# Patient Record
Sex: Female | Born: 1965 | Hispanic: Yes | Marital: Single | Smoking: Never smoker
Health system: Southern US, Community
[De-identification: ages and names within clinical notes are randomized; demographics above are authoritative.]

## PROBLEM LIST (undated history)

## (undated) DIAGNOSIS — E119 Type 2 diabetes mellitus without complications: Secondary | ICD-10-CM

## (undated) DIAGNOSIS — I10 Essential (primary) hypertension: Secondary | ICD-10-CM

---

## 2006-11-18 ENCOUNTER — Emergency Department: Payer: Self-pay | Admitting: General Practice

## 2006-12-14 ENCOUNTER — Other Ambulatory Visit: Payer: Self-pay

## 2006-12-17 ENCOUNTER — Ambulatory Visit: Payer: Self-pay | Admitting: Unknown Physician Specialty

## 2006-12-21 ENCOUNTER — Emergency Department: Payer: Self-pay | Admitting: Emergency Medicine

## 2009-01-10 ENCOUNTER — Emergency Department: Payer: Self-pay | Admitting: Emergency Medicine

## 2009-07-25 ENCOUNTER — Emergency Department: Payer: Self-pay | Admitting: Emergency Medicine

## 2010-08-01 ENCOUNTER — Emergency Department: Payer: Self-pay | Admitting: Emergency Medicine

## 2011-06-04 ENCOUNTER — Emergency Department: Payer: Self-pay | Admitting: Emergency Medicine

## 2011-06-21 ENCOUNTER — Emergency Department: Payer: Self-pay | Admitting: Internal Medicine

## 2013-09-29 LAB — URINALYSIS, COMPLETE
Blood: NEGATIVE
Glucose,UR: >=500 mg/dL (ref 0–75)
Leukocyte Esterase: NEGATIVE
Ph: 5 (ref 4.5–8.0)
Protein: 100
Specific Gravity: 1.02 (ref 1.003–1.030)
Squamous Epithelial: 4

## 2013-09-29 LAB — CBC
HGB: 14.2 g/dL (ref 12.0–16.0)
MCH: 23.6 pg — ABNORMAL LOW (ref 26.0–34.0)
MCHC: 31.4 g/dL — ABNORMAL LOW (ref 32.0–36.0)
MCV: 75 fL — ABNORMAL LOW (ref 80–100)
Platelet: 247 10*3/uL (ref 150–440)
RBC: 5.99 10*6/uL — ABNORMAL HIGH (ref 3.80–5.20)
RDW: 18.2 % — ABNORMAL HIGH (ref 11.5–14.5)

## 2013-09-29 LAB — COMPREHENSIVE METABOLIC PANEL
Albumin: 3.8 g/dL (ref 3.4–5.0)
Alkaline Phosphatase: 123 U/L (ref 50–136)
Anion Gap: 12 (ref 7–16)
BUN: 18 mg/dL (ref 7–18)
Chloride: 93 mmol/L — ABNORMAL LOW (ref 98–107)
Co2: 25 mmol/L (ref 21–32)
Creatinine: 1.72 mg/dL — ABNORMAL HIGH (ref 0.60–1.30)
EGFR (African American): 41 — ABNORMAL LOW
EGFR (Non-African Amer.): 35 — ABNORMAL LOW
Osmolality: 282 (ref 275–301)
Potassium: 4.3 mmol/L (ref 3.5–5.1)
SGOT(AST): 75 U/L — ABNORMAL HIGH (ref 15–37)
SGPT (ALT): 93 U/L — ABNORMAL HIGH (ref 12–78)
Sodium: 130 mmol/L — ABNORMAL LOW (ref 136–145)
Total Protein: 8.3 g/dL — ABNORMAL HIGH (ref 6.4–8.2)

## 2013-09-29 LAB — LIPASE, BLOOD: Lipase: 157 U/L (ref 73–393)

## 2013-09-29 LAB — TROPONIN I: Troponin-I: 0.08 ng/mL — ABNORMAL HIGH

## 2013-09-30 ENCOUNTER — Observation Stay: Payer: Self-pay | Admitting: Internal Medicine

## 2013-09-30 LAB — LIPID PANEL
HDL Cholesterol: 34 mg/dL — ABNORMAL LOW (ref 40–60)
Ldl Cholesterol, Calc: 108 mg/dL — ABNORMAL HIGH (ref 0–100)
Triglycerides: 140 mg/dL (ref 0–200)

## 2013-09-30 LAB — HEMOGLOBIN A1C: Hemoglobin A1C: 13 % — ABNORMAL HIGH (ref 4.2–6.3)

## 2013-09-30 LAB — CK TOTAL AND CKMB (NOT AT ARMC)
CK, Total: 43 U/L (ref 21–215)
CK-MB: 2.3 ng/mL (ref 0.5–3.6)

## 2013-09-30 LAB — BASIC METABOLIC PANEL
Anion Gap: 6 — ABNORMAL LOW (ref 7–16)
Calcium, Total: 8.9 mg/dL (ref 8.5–10.1)
Chloride: 98 mmol/L (ref 98–107)
Creatinine: 1.64 mg/dL — ABNORMAL HIGH (ref 0.60–1.30)
EGFR (Non-African Amer.): 37 — ABNORMAL LOW
Osmolality: 287 (ref 275–301)
Potassium: 3.8 mmol/L (ref 3.5–5.1)
Sodium: 134 mmol/L — ABNORMAL LOW (ref 136–145)

## 2013-09-30 LAB — TROPONIN I: Troponin-I: 0.07 ng/mL — ABNORMAL HIGH

## 2013-10-17 ENCOUNTER — Emergency Department: Payer: Self-pay

## 2013-10-17 LAB — CBC
HCT: 38.5 % (ref 35.0–47.0)
HGB: 11.5 g/dL — ABNORMAL LOW (ref 12.0–16.0)
MCHC: 29.8 g/dL — ABNORMAL LOW (ref 32.0–36.0)
Platelet: 225 10*3/uL (ref 150–440)
RBC: 4.83 10*6/uL (ref 3.80–5.20)
RDW: 18 % — ABNORMAL HIGH (ref 11.5–14.5)

## 2013-10-17 LAB — URINALYSIS, COMPLETE
Bilirubin,UR: NEGATIVE
Glucose,UR: >=500 mg/dL (ref 0–75)
Ketone: NEGATIVE
Leukocyte Esterase: NEGATIVE
Nitrite: NEGATIVE
Protein: NEGATIVE
Specific Gravity: 1.025 (ref 1.003–1.030)
WBC UR: 1 /HPF (ref 0–5)

## 2013-10-17 LAB — COMPREHENSIVE METABOLIC PANEL
Albumin: 4 g/dL (ref 3.4–5.0)
Alkaline Phosphatase: 229 U/L — ABNORMAL HIGH
Anion Gap: 14 (ref 7–16)
BUN: 27 mg/dL — ABNORMAL HIGH (ref 7–18)
Chloride: 80 mmol/L — ABNORMAL LOW (ref 98–107)
Creatinine: 1.8 mg/dL — ABNORMAL HIGH (ref 0.60–1.30)
EGFR (African American): 39 — ABNORMAL LOW
EGFR (Non-African Amer.): 33 — ABNORMAL LOW
Glucose: 1073 mg/dL (ref 65–99)
Potassium: 3.5 mmol/L (ref 3.5–5.1)
SGOT(AST): 58 U/L — ABNORMAL HIGH (ref 15–37)
Total Protein: 8.5 g/dL — ABNORMAL HIGH (ref 6.4–8.2)

## 2013-10-28 ENCOUNTER — Inpatient Hospital Stay: Payer: Self-pay | Admitting: Internal Medicine

## 2013-10-28 LAB — CBC
HCT: 39 % (ref 35.0–47.0)
HGB: 11.6 g/dL — ABNORMAL LOW (ref 12.0–16.0)
MCHC: 29.8 g/dL — ABNORMAL LOW (ref 32.0–36.0)
MCV: 82 fL (ref 80–100)
RBC: 4.75 10*6/uL (ref 3.80–5.20)
RDW: 18.6 % — ABNORMAL HIGH (ref 11.5–14.5)
WBC: 5.9 10*3/uL (ref 3.6–11.0)

## 2013-10-28 LAB — COMPREHENSIVE METABOLIC PANEL
Alkaline Phosphatase: 207 U/L — ABNORMAL HIGH
Anion Gap: 14 (ref 7–16)
Bilirubin,Total: 1.4 mg/dL — ABNORMAL HIGH (ref 0.2–1.0)
Calcium, Total: 10.1 mg/dL (ref 8.5–10.1)
Co2: 24 mmol/L (ref 21–32)
EGFR (African American): 28 — ABNORMAL LOW
Glucose: 1298 mg/dL (ref 65–99)
Osmolality: 297 (ref 275–301)
Potassium: 4.6 mmol/L (ref 3.5–5.1)
Sodium: 108 mmol/L — CL (ref 136–145)

## 2013-10-28 LAB — TSH: Thyroid Stimulating Horm: 3.86 u[IU]/mL

## 2013-10-29 LAB — BASIC METABOLIC PANEL
Anion Gap: 12 (ref 7–16)
BUN: 37 mg/dL — ABNORMAL HIGH (ref 7–18)
Calcium, Total: 10.3 mg/dL — ABNORMAL HIGH (ref 8.5–10.1)
Chloride: 92 mmol/L — ABNORMAL LOW (ref 98–107)
Co2: 29 mmol/L (ref 21–32)
Creatinine: 2.18 mg/dL — ABNORMAL HIGH (ref 0.60–1.30)
Creatinine: 2.3 mg/dL — ABNORMAL HIGH (ref 0.60–1.30)
EGFR (African American): 29 — ABNORMAL LOW
EGFR (Non-African Amer.): 25 — ABNORMAL LOW
Glucose: 214 mg/dL — ABNORMAL HIGH (ref 65–99)
Osmolality: 276 (ref 275–301)
Osmolality: 290 (ref 275–301)
Potassium: 3.2 mmol/L — ABNORMAL LOW (ref 3.5–5.1)
Potassium: 3.3 mmol/L — ABNORMAL LOW (ref 3.5–5.1)

## 2013-10-29 LAB — URINALYSIS, COMPLETE
Bilirubin,UR: NEGATIVE
Glucose,UR: >=500 mg/dL (ref 0–75)
Ph: 6 (ref 4.5–8.0)
Protein: NEGATIVE
Specific Gravity: 1.021 (ref 1.003–1.030)
Squamous Epithelial: 1
WBC UR: 8 /HPF (ref 0–5)

## 2013-10-30 LAB — CBC WITH DIFFERENTIAL/PLATELET
Basophil #: 0 10*3/uL (ref 0.0–0.1)
Basophil %: 1 %
HCT: 31.6 % — ABNORMAL LOW (ref 35.0–47.0)
Lymphocyte #: 1.7 10*3/uL (ref 1.0–3.6)
Lymphocyte %: 36.8 %
MCHC: 32.4 g/dL (ref 32.0–36.0)
Monocyte #: 0.5 x10 3/mm (ref 0.2–0.9)
Neutrophil %: 49.9 %
Platelet: 126 10*3/uL — ABNORMAL LOW (ref 150–440)
RBC: 4.19 10*6/uL (ref 3.80–5.20)
RDW: 19.1 % — ABNORMAL HIGH (ref 11.5–14.5)

## 2013-10-30 LAB — BASIC METABOLIC PANEL
BUN: 25 mg/dL — ABNORMAL HIGH (ref 7–18)
Calcium, Total: 9.5 mg/dL (ref 8.5–10.1)
Chloride: 100 mmol/L (ref 98–107)
Creatinine: 1.42 mg/dL — ABNORMAL HIGH (ref 0.60–1.30)
EGFR (Non-African Amer.): 44 — ABNORMAL LOW
Glucose: 247 mg/dL — ABNORMAL HIGH (ref 65–99)
Osmolality: 281 (ref 275–301)

## 2013-10-30 LAB — PHOSPHORUS: Phosphorus: 2 mg/dL — ABNORMAL LOW (ref 2.5–4.9)

## 2013-11-14 IMAGING — CT CT ABD-PELV W/ CM
2 of 5 series · 16 of 46 positions shown, 18 images · IV contrast (isovue)
Comparison: Abdominal ultrasound from the same day reported
separately.

CLINICAL DATA: 47-year- female with right abdominal pain. Initial
encounter.

EXAM:
CT ABDOMEN AND PELVIS WITH CONTRAST
TECHNIQUE: Multidetector CT imaging of the abdomen and pelvis was performed
using the standard protocol following bolus administration of
intravenous contrast.
CONTRAST:  100 mL Isovue 370.

[Series 2: routine abd pel with · axial · 0.81mm/px · z∈[+458,+868]mm · 13 of 94 slices shown, 15 images]
[im 6/94  soft-tissue]
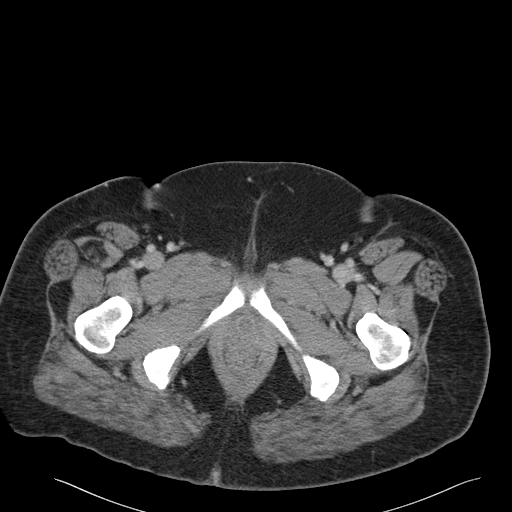
[im 6/94  bone]
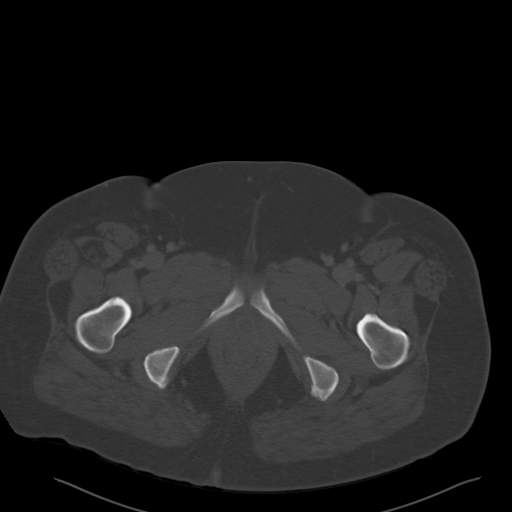
[im 11/94  soft-tissue]
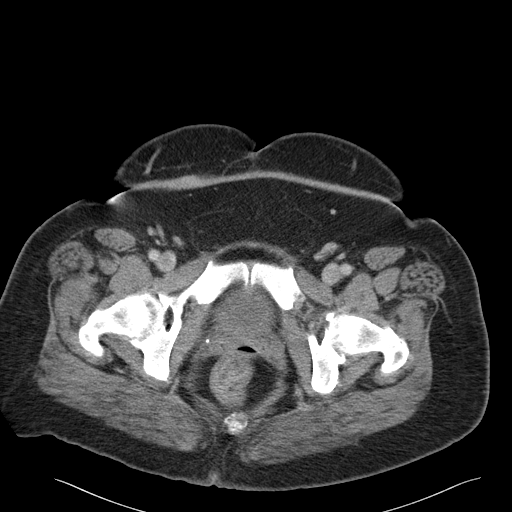
[im 21/94  soft-tissue]
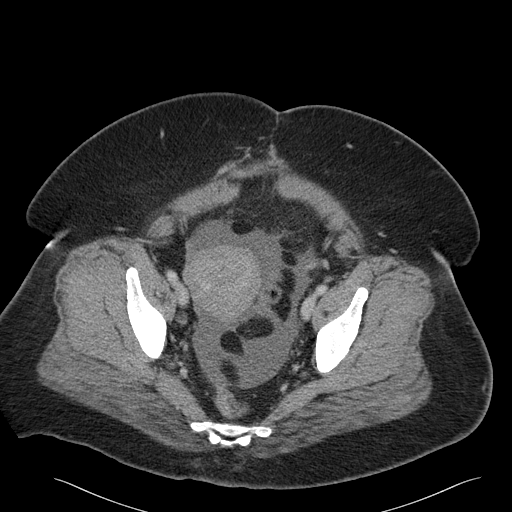
[im 26/94  soft-tissue]
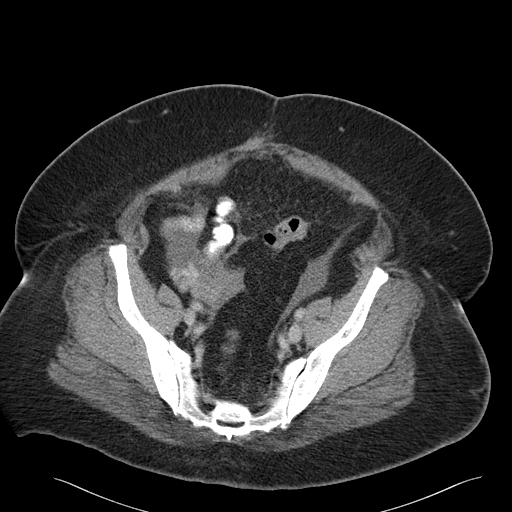
[im 32/94  soft-tissue]
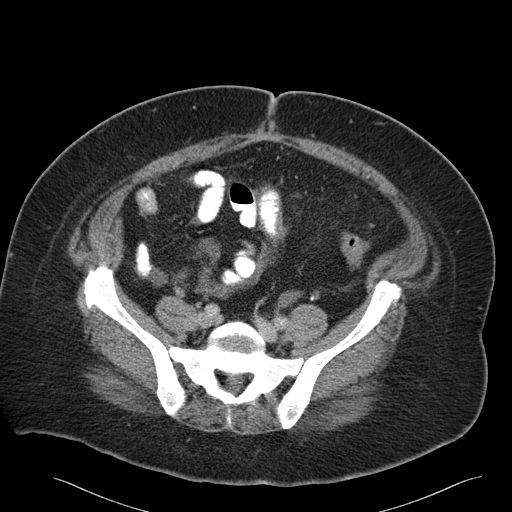
[im 42/94  soft-tissue]
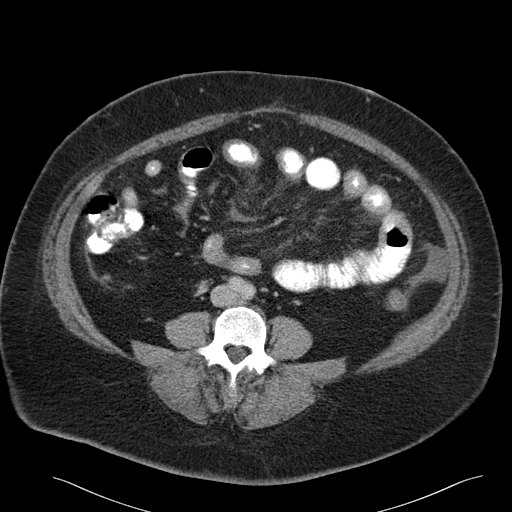
[im 47/94  soft-tissue]
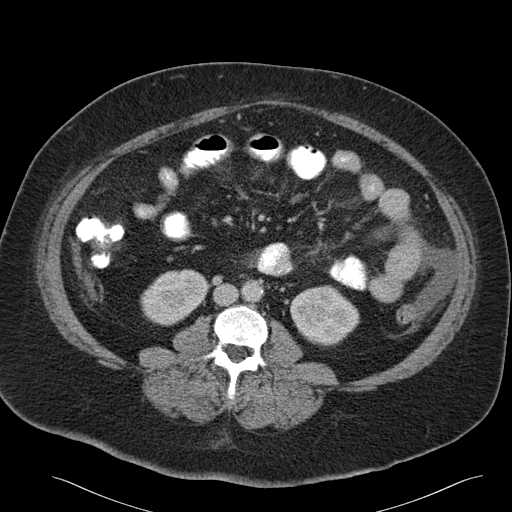
[im 52/94  soft-tissue]
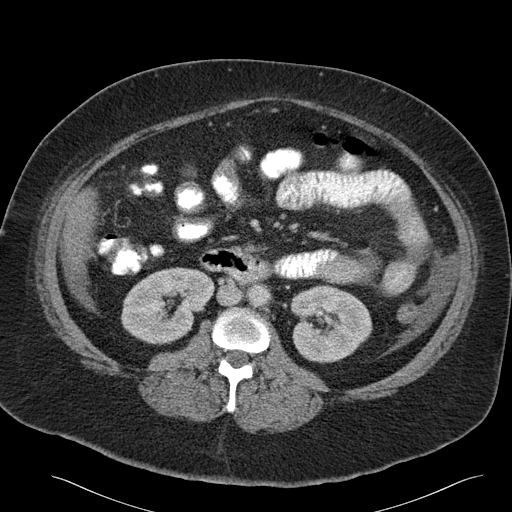
[im 63/94  soft-tissue]
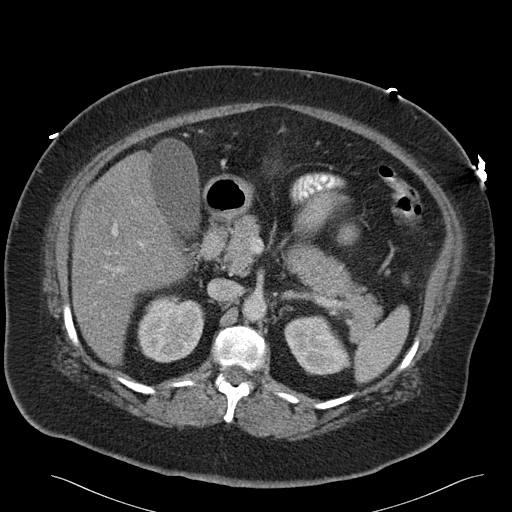
[im 63/94  bone]
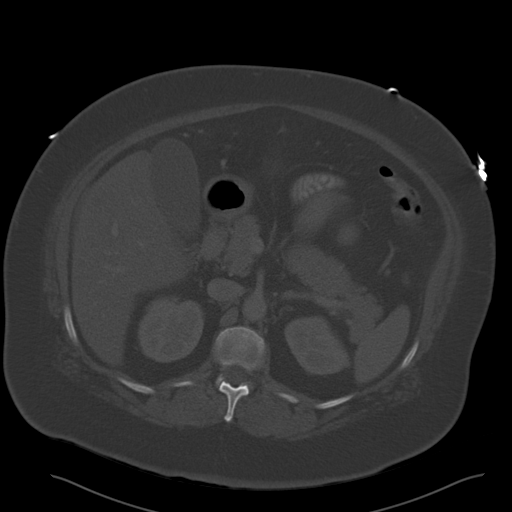
[im 68/94  soft-tissue]
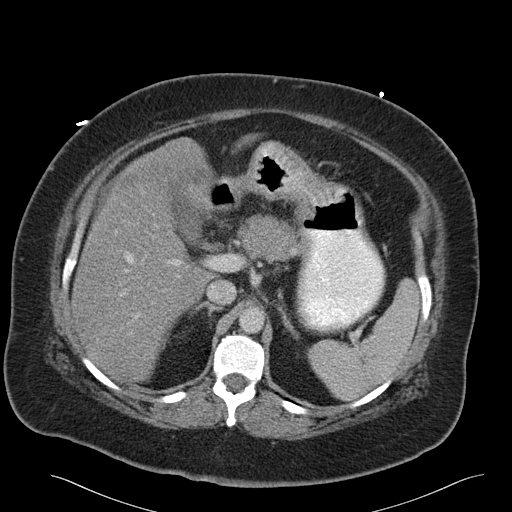
[im 73/94  soft-tissue]
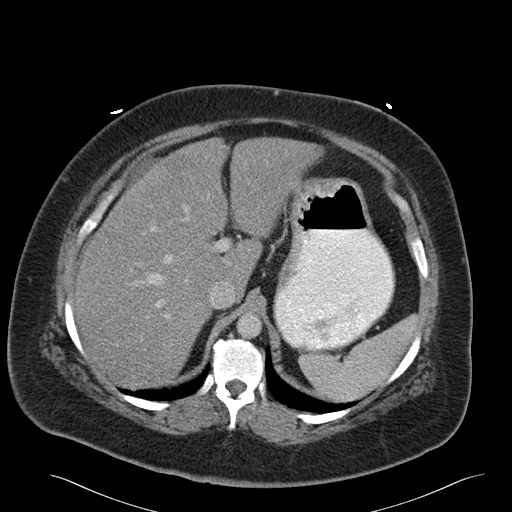
[im 83/94  soft-tissue]
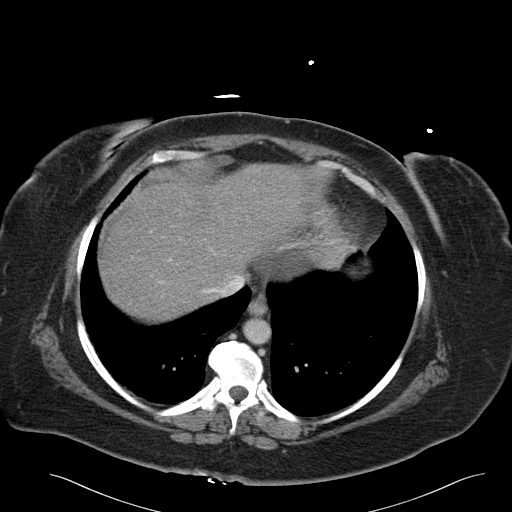
[im 88/94  soft-tissue]
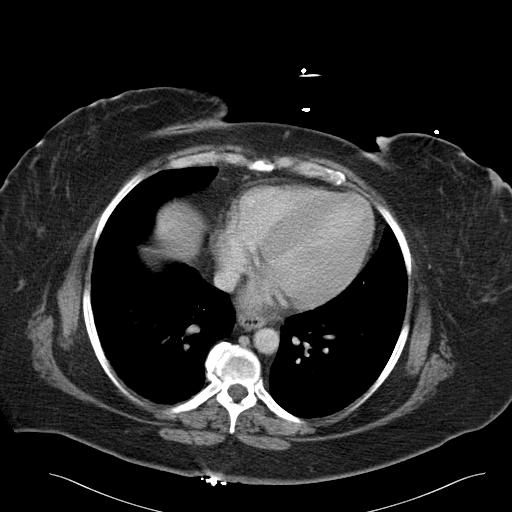

[Series 6: cor routine abd pel with · coronal · 0.91mm/px · 3 of 142 slices shown]
[im 48/142  soft-tissue]
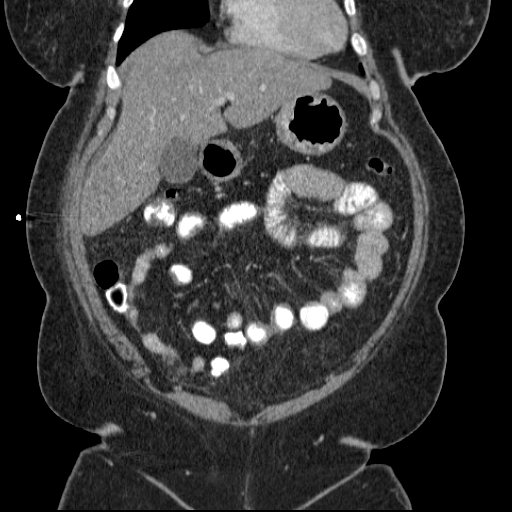
[im 63/142  soft-tissue]
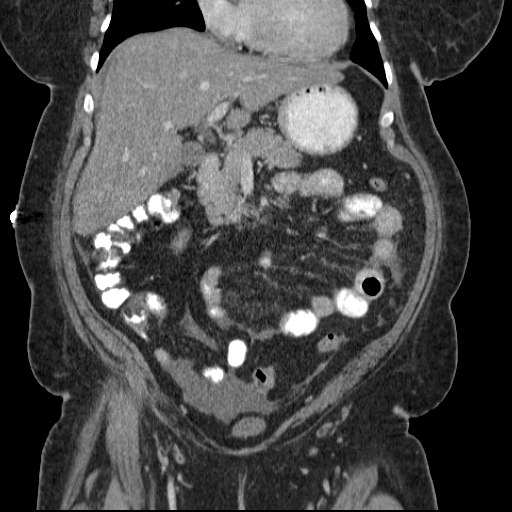
[im 79/142  soft-tissue]
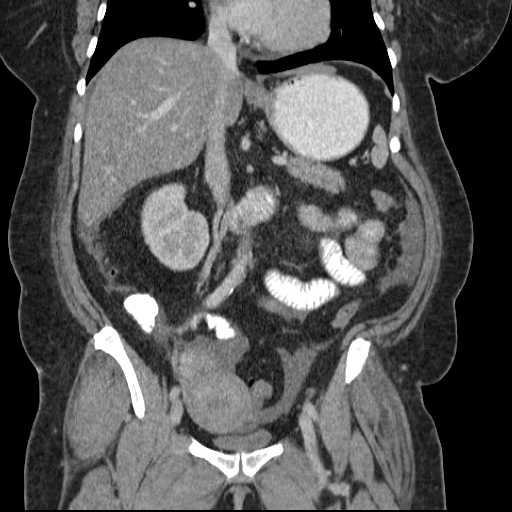

[16 of 46 positions shown; findings below may reference images not displayed]

FINDINGS: Mild bibasilar atelectasis. Mild cardiomegaly. No pericardial or
pleural effusion.

No acute osseous abnormality identified.

Small volume of simple appearing free fluid in the lower abdomen and
pelvis. The bladder is diminutive and appears within normal limits.
Rectum appears normal. Uterus and adnexal within normal limits.

Sigmoid colon appears within normal limits. There is free fluid in
the left pericolic gutter as well. The left colon does not appear
inflamed. Oral contrast has reached the splenic flexure. The
transverse colon appears within normal limits. There is trace right
pericolic gutter fluid. The right colon, cecum and appendix also
appear within normal limits. The appendix contains air and is
nondilated (series 2, image 51).

There does appear to be some small bowel wall thickening, somewhat
generalized. No dilated small bowel. Stomach and duodenum within
normal limits.

Hepatic steatosis. Small volume perihepatic fluid. Gallbladder
within normal limits. Main portal vein is patent with a diameter of
13 mm. The spleen is not enlarged. The remaining portal venous
system appears within normal limits.

Pancreas and adrenal glands within normal limits. Kidneys appear
normal. Major arterial structures in the abdomen and pelvis are
patent. Mild Aortoiliac calcified atherosclerosis noted. No
lymphadenopathy identified.
IMPRESSION: 1. Small to moderate volume free fluid in the abdomen and pelvis,
nonspecific, but combined with findings of hepatic steatosis and
prominent caliber of the main portal vein, consider ascites related
to liver disease with portal venous hypertension. No splenomegaly or
other stigmata of cirrhosis identified.

2. No focal large bowel inflammation, an the appendix appears
normal. There is some small bowel wall thickening in the mid
abdomen, but this may be reactive due to the presence of ascites.
Main differential consideration is nonspecific enteritis. There is
no bowel obstruction, contrast has reached the splenic flexure.

## 2013-12-14 IMAGING — US US RENAL KIDNEY
1 series · 14 of 25 positions shown · non-contrast
Comparison: CT scan of the abdomen and pelvis dated [place] she
[DATE].

CLINICAL DATA: Acute renal failure.

EXAM:
RENAL/URINARY TRACT ULTRASOUND COMPLETE

[Series 1: us renal kidney · 0.25mm/px · 14 of 32 slices shown]
[im 1/32]
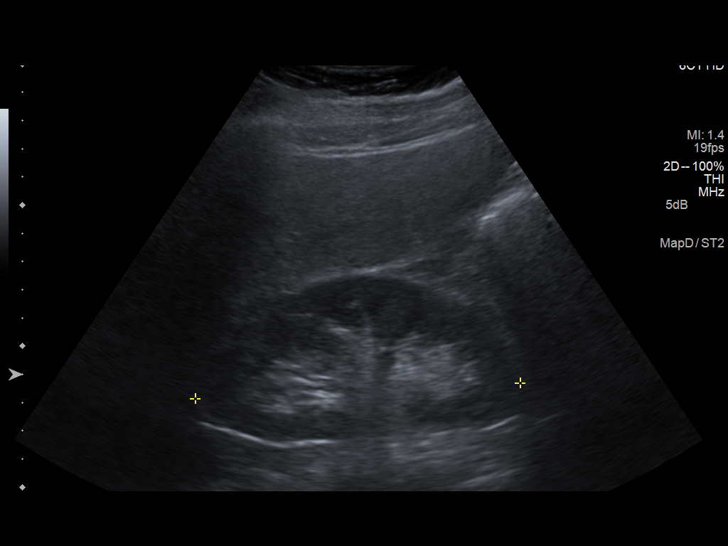
[im 3/32]
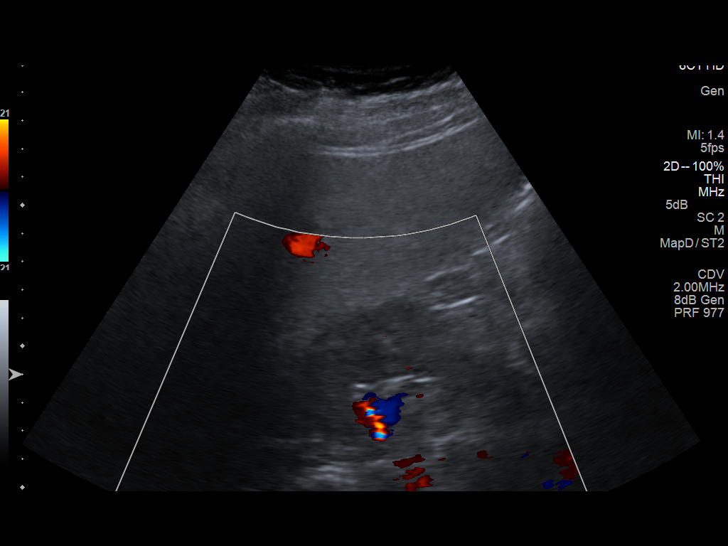
[im 6/32]
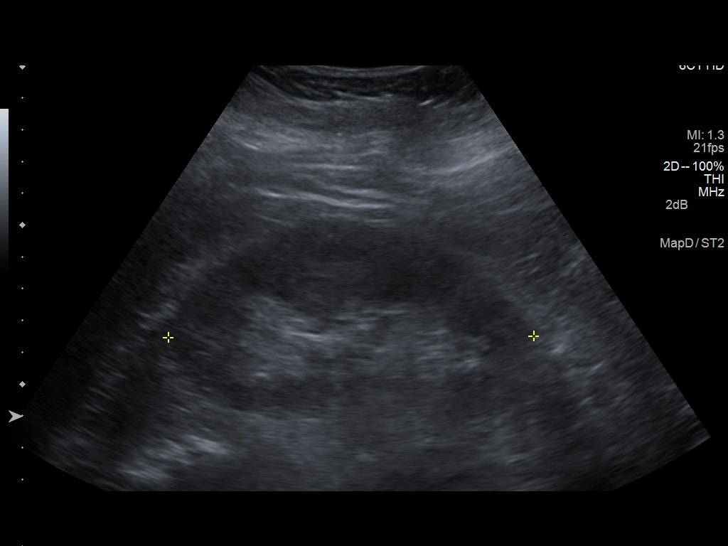
[im 8/32]
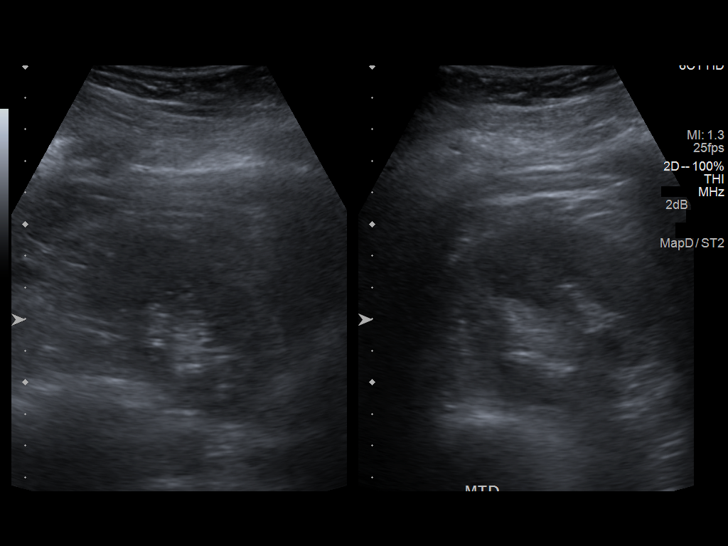
[im 11/32]
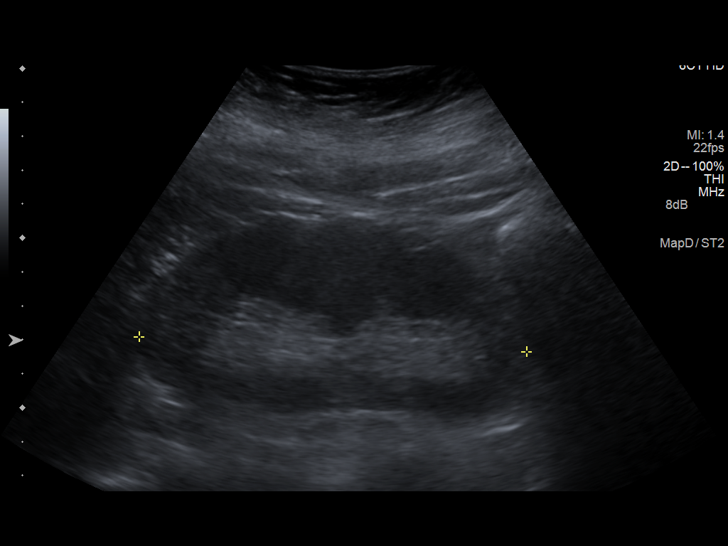
[im 12/32]
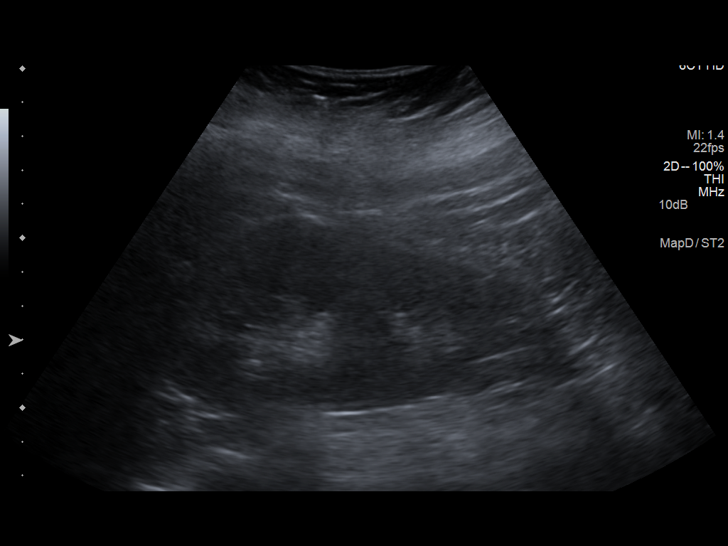
[im 15/32]
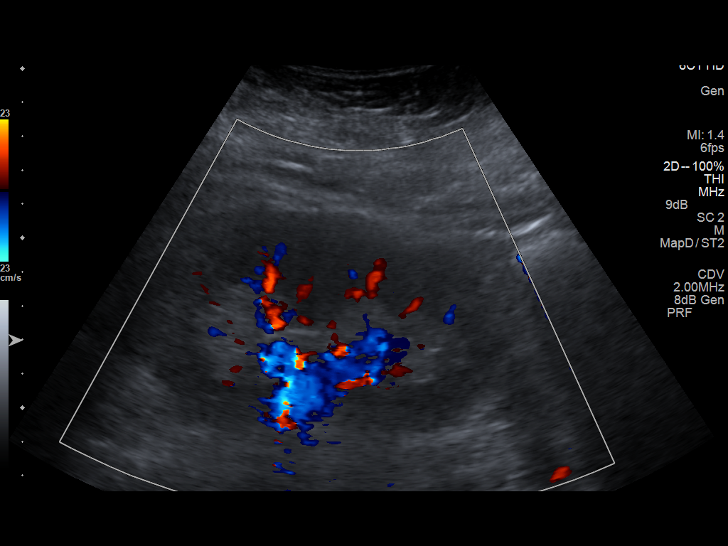
[im 17/32]
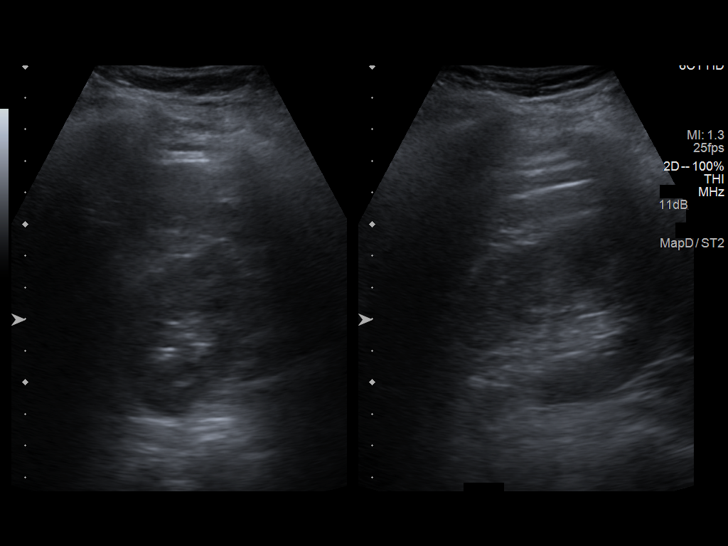
[im 20/32]
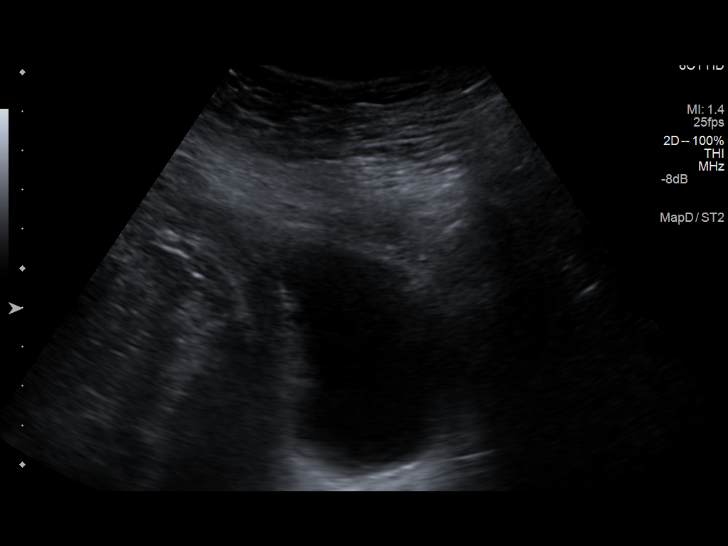
[im 21/32]
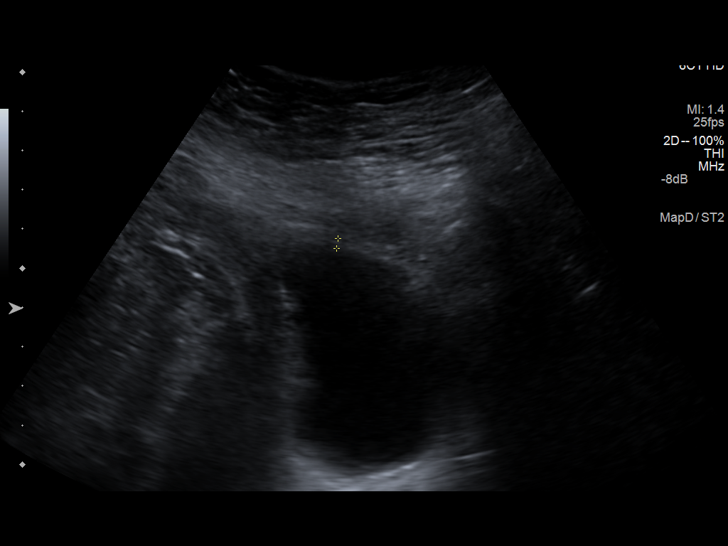
[im 24/32]
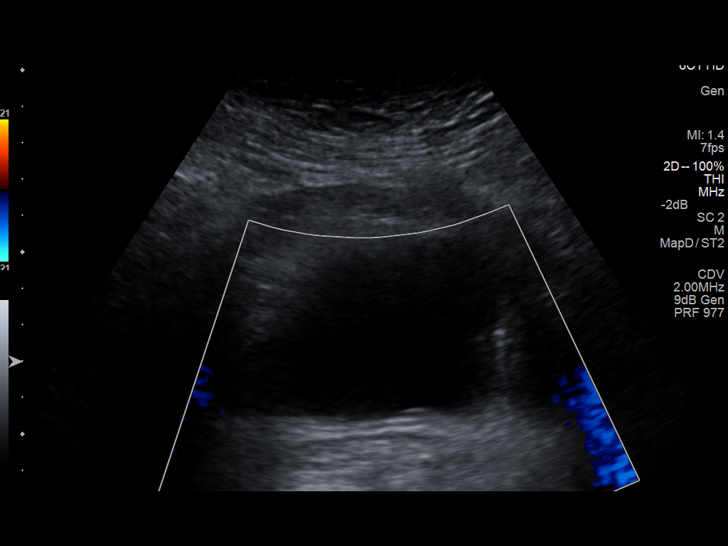
[im 26/32]
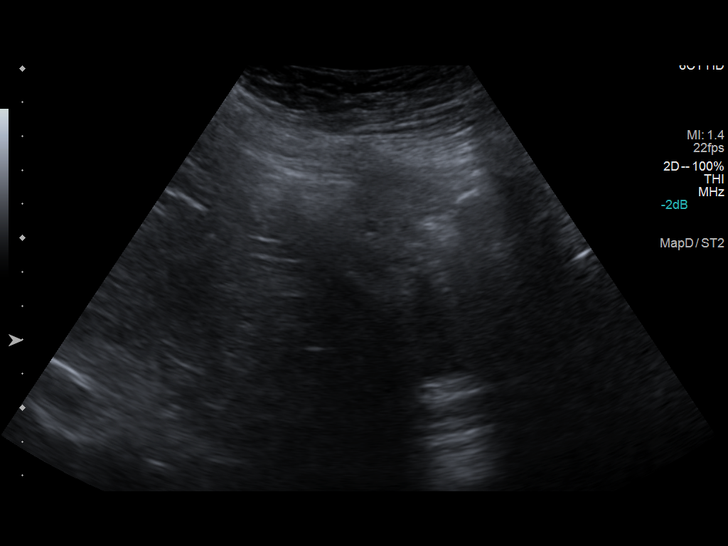
[im 29/32]
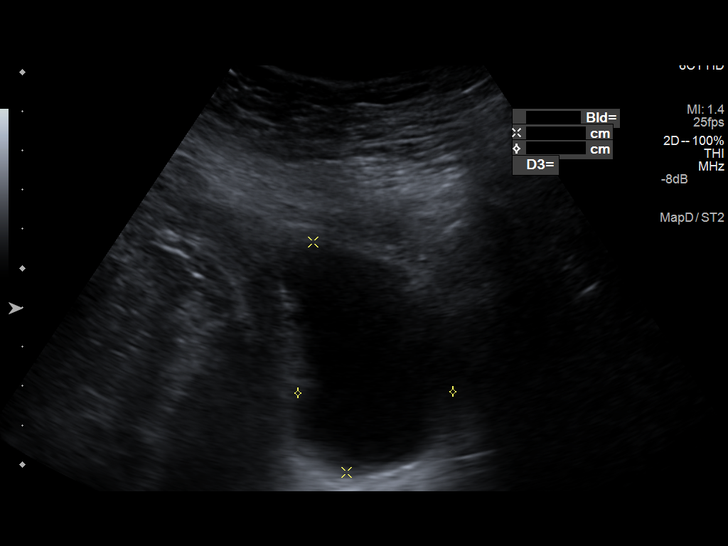
[im 32/32]
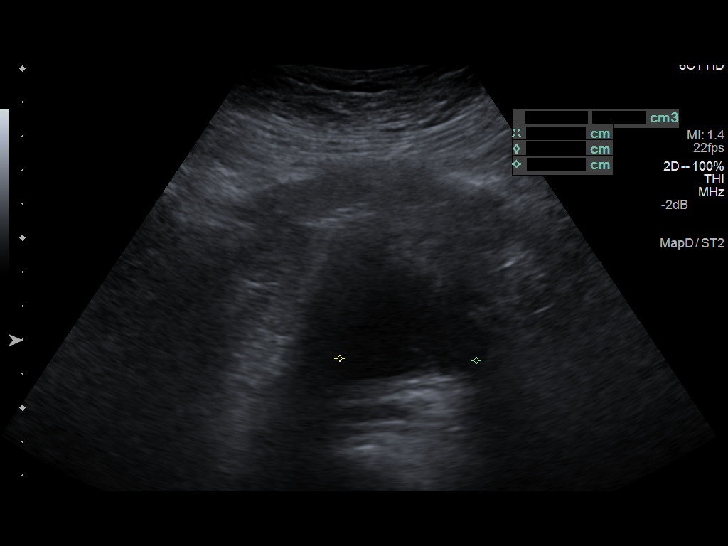

[14 of 25 positions shown; findings below may reference images not displayed]

FINDINGS: Right Kidney:

Length: 11.5 cm. Echogenicity within normal limits. No mass or
hydronephrosis visualized.

Left Kidney:

Length: 12.1 cm. Echogenicity within normal limits. No mass or
hydronephrosis visualized.

Bladder:

The urinary bladder is partially distended and normal in appearance.
IMPRESSION: 1. The kidneys are normal in echotexture and contour with no
evidence of stones nor hydronephrosis.
2. The partially distended urinary bladder is normal in appearance.

## 2014-04-26 ENCOUNTER — Emergency Department: Payer: Self-pay | Admitting: Emergency Medicine

## 2014-04-26 LAB — BASIC METABOLIC PANEL
Anion Gap: 2 — ABNORMAL LOW (ref 7–16)
BUN: 10 mg/dL (ref 7–18)
CHLORIDE: 106 mmol/L (ref 98–107)
Calcium, Total: 9.3 mg/dL (ref 8.5–10.1)
Co2: 29 mmol/L (ref 21–32)
Creatinine: 0.73 mg/dL (ref 0.60–1.30)
EGFR (African American): >60
Glucose: 76 mg/dL (ref 65–99)
OSMOLALITY: 272 (ref 275–301)
Potassium: 3.9 mmol/L (ref 3.5–5.1)
Sodium: 137 mmol/L (ref 136–145)

## 2014-04-26 LAB — CBC
HCT: 33.6 % — AB (ref 35.0–47.0)
HGB: 10.6 g/dL — ABNORMAL LOW (ref 12.0–16.0)
MCH: 24.8 pg — ABNORMAL LOW (ref 26.0–34.0)
MCHC: 31.5 g/dL — AB (ref 32.0–36.0)
MCV: 79 fL — AB (ref 80–100)
Platelet: 180 10*3/uL (ref 150–440)
RBC: 4.27 10*6/uL (ref 3.80–5.20)
RDW: 16.6 % — ABNORMAL HIGH (ref 11.5–14.5)
WBC: 8.3 10*3/uL (ref 3.6–11.0)

## 2014-05-04 ENCOUNTER — Emergency Department: Payer: Self-pay | Admitting: Internal Medicine

## 2014-05-04 LAB — URINALYSIS, COMPLETE
BILIRUBIN, UR: NEGATIVE
Bacteria: NONE SEEN
Glucose,UR: NEGATIVE mg/dL (ref 0–75)
Ketone: NEGATIVE
NITRITE: NEGATIVE
PH: 5 (ref 4.5–8.0)
PROTEIN: NEGATIVE
RBC,UR: 40 /HPF (ref 0–5)
Specific Gravity: 1.021 (ref 1.003–1.030)
WBC UR: <1 /HPF (ref 0–5)

## 2014-05-04 LAB — CBC
HCT: 34.2 % — ABNORMAL LOW (ref 35.0–47.0)
HGB: 10.9 g/dL — ABNORMAL LOW (ref 12.0–16.0)
MCH: 24.9 pg — ABNORMAL LOW (ref 26.0–34.0)
MCHC: 31.8 g/dL — ABNORMAL LOW (ref 32.0–36.0)
MCV: 79 fL — ABNORMAL LOW (ref 80–100)
Platelet: 230 10*3/uL (ref 150–440)
RBC: 4.35 10*6/uL (ref 3.80–5.20)
RDW: 17.2 % — ABNORMAL HIGH (ref 11.5–14.5)
WBC: 11 10*3/uL (ref 3.6–11.0)

## 2014-05-30 ENCOUNTER — Observation Stay: Payer: Self-pay | Admitting: Internal Medicine

## 2014-05-30 LAB — BASIC METABOLIC PANEL
ANION GAP: 6 — AB (ref 7–16)
BUN: 13 mg/dL (ref 7–18)
CHLORIDE: 103 mmol/L (ref 98–107)
CO2: 26 mmol/L (ref 21–32)
Calcium, Total: 9.7 mg/dL (ref 8.5–10.1)
Creatinine: 0.83 mg/dL (ref 0.60–1.30)
EGFR (African American): >60
EGFR (Non-African Amer.): >60
GLUCOSE: 154 mg/dL — AB (ref 65–99)
OSMOLALITY: 273 (ref 275–301)
POTASSIUM: 4.3 mmol/L (ref 3.5–5.1)
Sodium: 135 mmol/L — ABNORMAL LOW (ref 136–145)

## 2014-05-30 LAB — CBC
HCT: 22.4 % — AB (ref 35.0–47.0)
HGB: 7.1 g/dL — AB (ref 12.0–16.0)
MCH: 24.9 pg — AB (ref 26.0–34.0)
MCHC: 31.9 g/dL — ABNORMAL LOW (ref 32.0–36.0)
MCV: 78 fL — ABNORMAL LOW (ref 80–100)
Platelet: 202 10*3/uL (ref 150–440)
RBC: 2.87 10*6/uL — ABNORMAL LOW (ref 3.80–5.20)
RDW: 17.4 % — ABNORMAL HIGH (ref 11.5–14.5)
WBC: 9.4 10*3/uL (ref 3.6–11.0)

## 2014-05-30 LAB — PRO B NATRIURETIC PEPTIDE: B-Type Natriuretic Peptide: 46 pg/mL (ref 0–125)

## 2014-05-30 LAB — TROPONIN I
Troponin-I: <0.02 ng/mL
Troponin-I: <0.02 ng/mL

## 2014-05-31 LAB — CBC WITH DIFFERENTIAL/PLATELET
BASOS PCT: 0.5 %
Basophil #: 0.1 10*3/uL (ref 0.0–0.1)
EOS ABS: 0.3 10*3/uL (ref 0.0–0.7)
Eosinophil %: 2.9 %
HCT: 26.5 % — ABNORMAL LOW (ref 35.0–47.0)
HGB: 8.4 g/dL — ABNORMAL LOW (ref 12.0–16.0)
LYMPHS ABS: 2.2 10*3/uL (ref 1.0–3.6)
Lymphocyte %: 22.8 %
MCH: 24.6 pg — ABNORMAL LOW (ref 26.0–34.0)
MCHC: 31.5 g/dL — AB (ref 32.0–36.0)
MCV: 78 fL — ABNORMAL LOW (ref 80–100)
Monocyte #: 0.7 x10 3/mm (ref 0.2–0.9)
Monocyte %: 6.9 %
NEUTROS ABS: 6.5 10*3/uL (ref 1.4–6.5)
Neutrophil %: 66.9 %
Platelet: 205 10*3/uL (ref 150–440)
RBC: 3.4 10*6/uL — ABNORMAL LOW (ref 3.80–5.20)
RDW: 17 % — ABNORMAL HIGH (ref 11.5–14.5)
WBC: 9.7 10*3/uL (ref 3.6–11.0)

## 2014-06-18 LAB — CBC
HCT: 22.2 % — AB (ref 35.0–47.0)
HGB: 7 g/dL — ABNORMAL LOW (ref 12.0–16.0)
MCH: 26.1 pg (ref 26.0–34.0)
MCHC: 31.5 g/dL — ABNORMAL LOW (ref 32.0–36.0)
MCV: 83 fL (ref 80–100)
Platelet: 236 10*3/uL (ref 150–440)
RBC: 2.68 10*6/uL — ABNORMAL LOW (ref 3.80–5.20)
RDW: 21.2 % — ABNORMAL HIGH (ref 11.5–14.5)
WBC: 5.8 10*3/uL (ref 3.6–11.0)

## 2014-06-18 LAB — COMPREHENSIVE METABOLIC PANEL
Albumin: 3.4 g/dL (ref 3.4–5.0)
Alkaline Phosphatase: 75 U/L
Anion Gap: 7 (ref 7–16)
BILIRUBIN TOTAL: 0.8 mg/dL (ref 0.2–1.0)
BUN: 12 mg/dL (ref 7–18)
CALCIUM: 9 mg/dL (ref 8.5–10.1)
Chloride: 108 mmol/L — ABNORMAL HIGH (ref 98–107)
Co2: 25 mmol/L (ref 21–32)
Creatinine: 1.03 mg/dL (ref 0.60–1.30)
EGFR (African American): >60
EGFR (Non-African Amer.): >60
GLUCOSE: 152 mg/dL — AB (ref 65–99)
Osmolality: 282 (ref 275–301)
POTASSIUM: 3.6 mmol/L (ref 3.5–5.1)
SGOT(AST): 16 U/L (ref 15–37)
SGPT (ALT): 18 U/L
Sodium: 140 mmol/L (ref 136–145)
Total Protein: 7.2 g/dL (ref 6.4–8.2)

## 2014-06-18 LAB — PROTIME-INR
INR: 1
Prothrombin Time: 13.5 secs (ref 11.5–14.7)

## 2014-06-18 LAB — TROPONIN I

## 2014-06-18 LAB — PRO B NATRIURETIC PEPTIDE: B-Type Natriuretic Peptide: 444 pg/mL — ABNORMAL HIGH (ref 0–125)

## 2014-06-19 ENCOUNTER — Observation Stay: Payer: Self-pay | Admitting: Internal Medicine

## 2014-06-19 LAB — CK TOTAL AND CKMB (NOT AT ARMC)
CK, TOTAL: 81 U/L
CK, TOTAL: 92 U/L
CK, Total: 81 U/L
CK-MB: 0.6 ng/mL (ref 0.5–3.6)
CK-MB: 0.7 ng/mL (ref 0.5–3.6)
CK-MB: 0.8 ng/mL (ref 0.5–3.6)

## 2014-06-19 LAB — TROPONIN I

## 2014-06-19 LAB — IRON AND TIBC
IRON SATURATION: 12 %
Iron Bind.Cap.(Total): 430 ug/dL (ref 250–450)
Iron: 50 ug/dL (ref 50–170)
Unbound Iron-Bind.Cap.: 380 ug/dL

## 2014-06-19 LAB — FERRITIN: FERRITIN (ARMC): 10 ng/mL (ref 8–388)

## 2014-06-19 LAB — HEMOGLOBIN: HGB: 9.2 g/dL — ABNORMAL LOW (ref 12.0–16.0)

## 2014-06-24 ENCOUNTER — Emergency Department: Payer: Self-pay | Admitting: Emergency Medicine

## 2014-07-22 ENCOUNTER — Emergency Department: Payer: Self-pay | Admitting: Internal Medicine

## 2014-08-03 IMAGING — CR DG CHEST 1V PORT
1 series · 1 of 1 positions shown · non-contrast
Comparison: 05/30/2014

CLINICAL DATA: Sternal chest pain

EXAM:
PORTABLE CHEST - 1 VIEW

[ap]
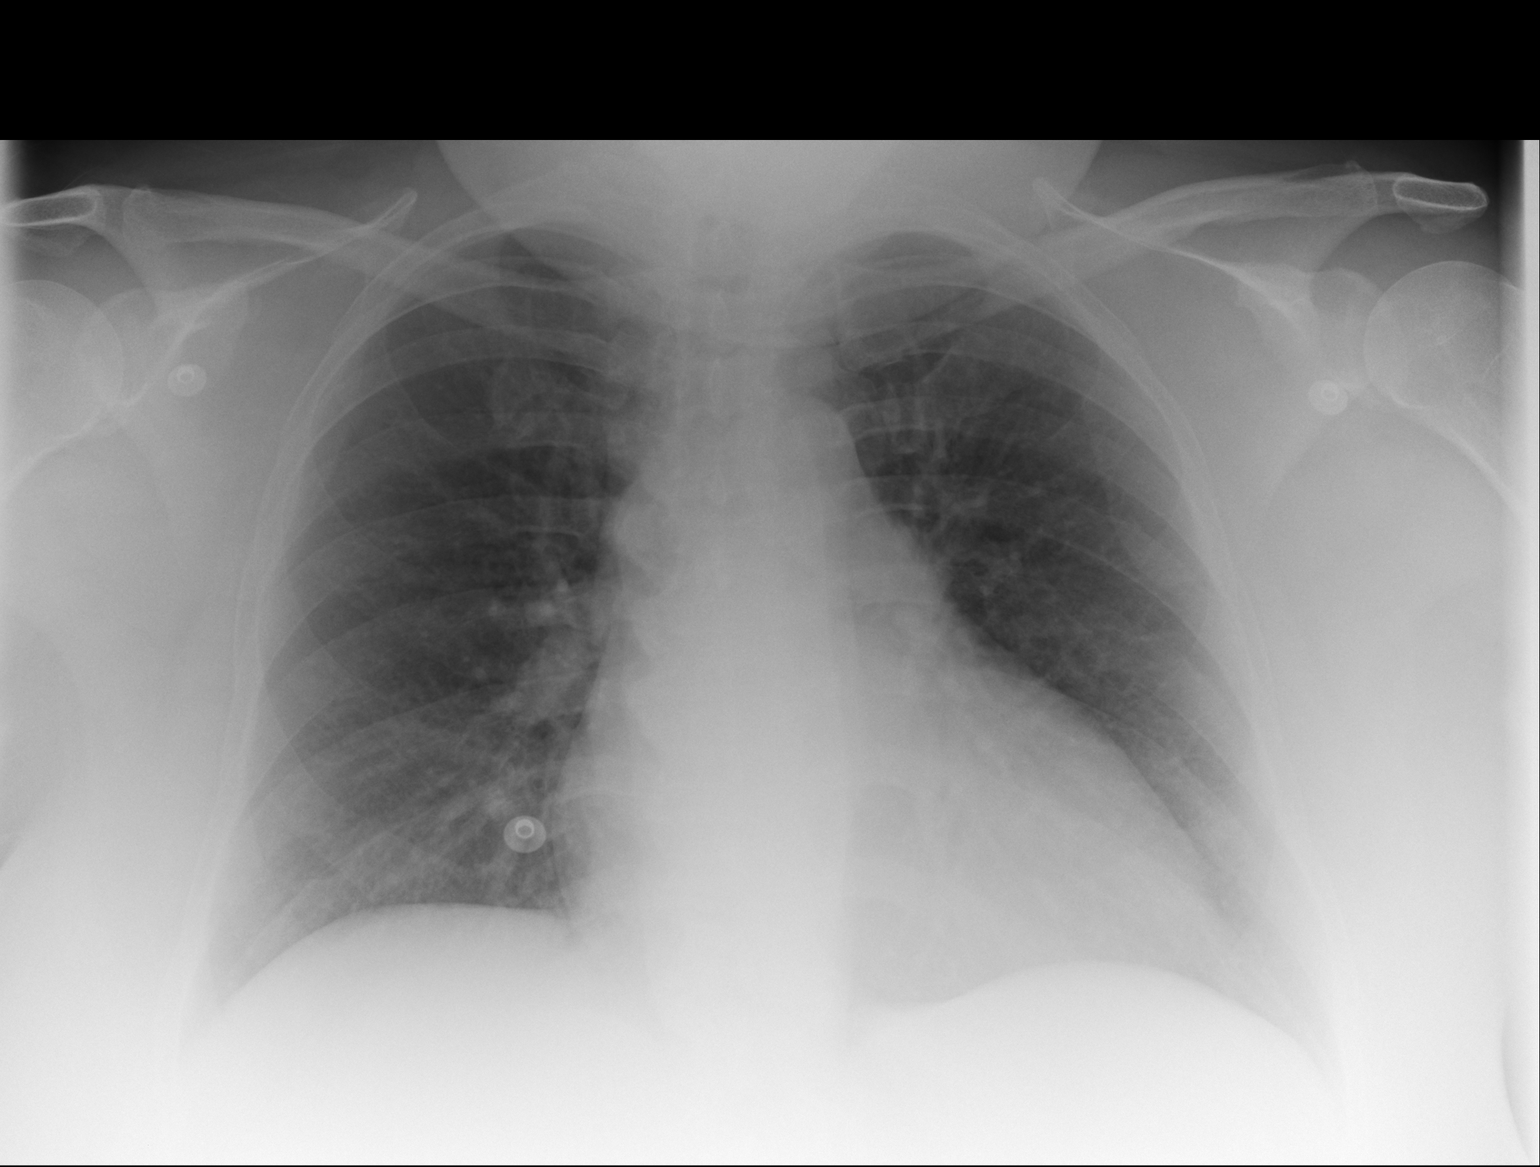

[1 of 1 positions shown; findings below may reference images not displayed]

FINDINGS: Mild cardiac enlargement. Vascular pattern normal. Lungs clear. No
effusions.
IMPRESSION: No active disease.

## 2015-03-16 NOTE — Discharge Summary (Signed)
PATIENT NAME:  Krystal Patel, Krystal Patel MR#:  [id number] DATE OF BIRTH:  1965-11-28  DATE OF ADMISSION:  09/30/2013 DATE OF DISCHARGE:  09/30/2013  DATE OF BIRTH: [birth year].   ADMISSION DIAGNOSES: 1.  Acute kidney injury.  2.  Epigastric abdominal pain.   DISCHARGE DIAGNOSES: 1.  Epigastric abdominal pain, possibly related to peptic ulcer disease.  2.  Acute kidney injury,  resolving.  3.  Hyponatremia.  4.  Diabetes. 5.  Elevated liver function tests. W  6.  Hypertension. 7.  Hyperlipidemia. 8.  Elevated troponin.  CONSULTATIONS: None.   The patient's sodium is 134.  Potassium  3.8, chloride 98, bicarb 30, BUN 21, creatinine 1.64. Glucose is 386.  LDL was 108. Cholesterol 170. Troponin max was 0.08. Troponin at discharge was 0.07.   CT scan of the abdomen and pelvis performed on admission showed a small to moderate volume of free fluid in the  abdomen and pelvis, nonspecific. Could be hepatic steatosis. No large bowel inflammation. Appendix was normal.   HOSPITAL COURSE:  A 49 year old female presented with midepigastric abdominal pain, found to have acute kidney injury. For further details, please refer to the H and P.  1.  Midepigastric abdominal pain. The patient denies any melena or hematochezia. This could be related to peptic ulcer disease. She was placed on a PPI. She tolerated her diet quite well. We recommend a PPI for now.   2.  Acute renal insufficiency, improved with IV fluids.  This is likely secondary to dehydration, 3.  Hyponatremia from dehydration, and nausea and vomiting, which improved.  4.  Diabetes. The patient will need outpatient  followup for better control of her blood sugars. I have discontinued the metformin, as this could be causing the renal injury and change to glyburide.  5.  Elevated liver function tests.  CT shows hepatic steatosis from diabetes.  The patient will need outpatient  followup. 6.  Hypertension. The patient can be restarted on her outpatient  medications,  as her creatinine has improved.    7.  Hyperlipidemia.  The patient will continue on atorvastatin. 8.  Elevated troponin secondary to some possibly demand ischemia. There is no evidence of non-ST elevation MI and her troponins were flat.   DISCHARGE MEDICATIONS: 1.  Lisinopril 20 mg daily.  2.  Hydrochlorothiazide 25 mg daily. 3.  Pantoprazole 40 mg t.i.d.  4.  Glyburide 5 mg b.i.d.   DISCHARGE DIET:  Low-sodium, ADA diet.   DISCHARGE ACTIVITY:  As tolerated.  DISCHARGE FOLLOWUP:  The patient will follow up with Dr. Dario GuardianJadali for kidney function test and diabetes.   TIME SPENT:  Approximately 35 minutes.   The patient is medically stable for discharge.   ____________________________ Nuchem Grattan P. Juliene PinaMody, MD spm:dmm D: 09/30/2013 20:26:00 ET T: 09/30/2013 22:04:15 ET JOB#: [id number]  cc: Michelena Culmer P. Juliene PinaMody, MD, <Dictator> Marlyn CorporalFayegh H. Jadali, MD Janyth ContesSITAL P Trayven Lumadue MD ELECTRONICALLY SIGNED 10/03/2013 13:45

## 2015-03-16 NOTE — Discharge Summary (Signed)
PATIENT NAME:  Krystal Patel, Krystal Patel DATE OF BIRTH:  07-Mar-1966  DATE OF ADMISSION:  10/28/2013 DATE OF DISCHARGE:  10/31/2013  ADMITTING DIAGNOSIS: Generalized weakness, fatigue, severe hyperglycemia.   DISCHARGE DIAGNOSES:  1. Generalized weakness, fatigue due to hyperosmolar, nonketotic, hyperglycemic state.  2. Diabetes type 2 with poor control due to medication noncompliance and poor diet. The patient counseled regarding compliance. Her insulin Levemir dose has been increased.  3. Acute kidney injury, felt to be due to dehydration. The patient's renal function is now improved. She was restarted on lisinopril. We have stopped her hydrochlorothiazide at discharge.  4. Hyponatremia due to inappropriately elevated blood glucose, which is false elevation, but she was also hyponatremic due to volume depletion. She was given IV hydration, and her sodium is normalized.  5. Hypertension.  6. Transaminitis felt to be due to nonalcoholic fatty liver disease.  7. Morbid obesity.  8. Hyperlipidemia.  9. Anxiety.  10. Hypothyroidism.   CONSULTANTS: Dr. Thedore MinsSingh of nephrology as well as case management as well as inpatient diabetic teaching.   HOSPITAL COURSE: Please refer to H and P done by the admitting physician. The patient is a 49 year old female with a history of type 2 diabetes, poorly controlled, hypertension and hyperlipidemia, who presented with generalized weakness and blood sugars that were not able  to be recorded on her glucometer. The patient came to the ED and was noted to have a blood glucose of 1298. Her creatinine was 2.37, and her sodium was 108. She was admitted for hyperosmolar, nonketotic hyperglycemia. She was treated with aggressive IV fluids and was started on insulin drip. Once she was placed on the insulin drip, her blood sugars improved. She has had a problem with following a diet and medication compliance. Her sugars have much improved, in the 200 range, but we had to  increase her insulin. Her hemoglobin A1c was so high that it had to be sent away to the laboratory, so we do not have the results of that. She also was noted to have acute renal failure felt to be due to dehydration. She was treated with aggressive IV fluids, with improvement in her renal function. She will follow up outpatient with nephrology. She, at this time, is doing much better and is stable for discharge. I strongly recommended that she be compliant with her medications, keep a log to take to her primary care provider.   DISCHARGE MEDICATIONS:  1. Lisinopril 20 daily.  2. Levemir 28 units subcutaneous b.i.d. 3. Aspirin 81 mg 1 tab p.o. daily.   DIET: Regular, carbohydrate-controlled, 4 gram sodium, low fat diet.   ACTIVITY: As tolerated.   DISCHARGE INSTRUCTIONS: Follow up with primary MD in 1 to 2 weeks. The patient is told not to take the hydrochlorothiazide. Check blood glucose before every meal. Keep a log to take to primary MD.   TIME SPENT: 32 minutes spent on this patient.    ____________________________ Lacie ScottsShreyang H. Allena KatzPatel, MD shp:lb D: 11/01/2013 08:36:28 ET T: 11/01/2013 08:57:48 ET JOB#: [id number]  cc: Suheily Birks H. Allena KatzPatel, MD, <Dictator> Charise CarwinSHREYANG H Paytience Bures MD ELECTRONICALLY SIGNED 11/04/2013 12:48

## 2015-03-16 NOTE — H&P (Signed)
PATIENT NAME:  Krystal Patel, Krystal Patel MR#:  [id number] DATE OF BIRTH:  03-Oct-1966  DATE OF ADMISSION:  09/29/2013  REFERRING PHYSICIAN: Dr. Cyril Loosen  PRIMARY CARE PHYSICIAN: Dr. Dario Guardian    CHIEF COMPLAINT: Abdominal pain.   HISTORY OF PRESENT ILLNESS:  This is a 49 year old female with a past clinical type 2 diabetes non-insulin-requiring, hypertension, hyperlipidemia, hypothyroidism presenting with abdominal pain. She states this has been one day duration of epigastric pain described as intermittent, sharp, 8 out of 10 in intensity, radiating to her bilateral flank areas with no worsening or relieving factors. She denotes no association with food. She however, does mention having associated nausea and vomiting nonbloody, nonbilious emesis multiple times. Denies any history of recent sick contacts, travel or strange food intake.   REVIEW OF SYSTEMS: CONSTITUTIONAL: Denies any fever, fatigue, weakness. Positive for pain as above.  EYES: Denies blurred vision, double vision, eye pain.  ENT: Denies ear pain, hearing loss or discharge.  RESPIRATORY: Denies cough, wheeze, shortness of breath.  CARDIOVASCULAR: Denies chest pain, palpitations, edema.  GASTROINTESTINAL: Positive for nausea, vomiting and abdominal pain. Denies diarrhea or constipation. Denies any hematemesis.  GENITOURINARY: Denies dysuria, hematuria.  ENDOCRINE: Denies nocturia or thyroid problems.  HEMATOLOGIC AND LYMPHATIC: Denies easy bruising or bleeding.  SKIN: Denies rash or lesions.  MUSCULOSKELETAL: Denies any pain in her neck, back, shoulder, knees, hips, arthritic symptoms.  NEUROLOGIC: Denies paralysis, paresthesias.  PSYCHIATRIC: Denies anxiety or depressive symptoms.  Otherwise, full review of systems performed by me is negative.   PAST MEDICAL HISTORY: Type 2 diabetes, hypertension, hyperlipidemia, anxiety, hypothyroidism and morbid obesity.   FAMILY HISTORY: Hypertension, diabetes.   SOCIAL HISTORY: Positive for every day  tobacco abuse and social alcohol use, last drink two days ago.   Allergies: NKDA Home Meds: hydrochlorothiazide  oral daily lisinopril  oral daily metformin  oral twice daily   PHYSICAL EXAMINATION: VITAL SIGNS: Temperature 98.6, heart rate 92, respirations 18, blood pressure 160/71, saturating 96% on room air. Weight 105.2 kg, BMI 43.9.  GENERAL: Obese, well-developed female lying currently in no acute distress.  HEAD: Normocephalic, atraumatic.  EYES: Pupils equal, round and reactive to light. Extraocular movements intact. No scleral icterus.  MOUTH: Dry mucosal membranes. Poor dentition. No abscess noted.  EARS, NOSE, THROAT:  Throat clear without exudates. No external lesions.  NECK: Supple. No thyromegaly. No nodules. No JVD.  PULMONARY: Clear to auscultation bilaterally. No wheezes, rales, or rhonchi. No use of accessory muscles. Good respiratory effort.  CHEST: Nontender to palpation.  CARDIOVASCULAR: S1, S2, regular rate and rhythm. No murmurs, rubs, or gallops. No edema. Pedal pulses 2+ bilaterally.  GASTROINTESTINAL: Soft, palpable epigastric tenderness without rebound or guarding. Nondistended. No masses, obese, positive bowel sounds. No hepatosplenomegaly. Murphy sign negative.  MUSCULOSKELETAL: No swelling, clubbing or edema. Range of motion is full.  NEUROLOGIC: Cranial nerves II through XII intact. No gross deficit intact. Sensation intact, reflexes intact.  SKIN: No ulcerations, lesions, rashes, no cyanosis. Skin is warm and dry. Turgor is intact.  PSYCHIATRIC: Mood and affect within normal limits. She is awake, alert and oriented x 3. Insight and judgment are intact.   LABORATORY DATA: Sodium 130, potassium 4.3, chloride 93, bicarbonate 25, BUN 18, creatinine 1.72 glucose 442, calcium 10.2. LFTs: Protein 8.3, albumin 3.8, bilirubin 1.3, alkaline phosphatase 123, AST 75, ALT is 93, troponin I 0.08, WBC 15, hemoglobin 14.2, platelets of 275.   Urinalysis:  Leukocyte esterase, nitrate negative.   EKG: Sinus tachycardia at 116.   CT of the  abdomen performed revealing small amount of fluid likely ascites as well as hepatic steatosis, small bowel thickening which is described as nonspecific enteritis.   Right upper quadrant ultrasound was performed, gallbladder and biliary tree within normal limits.   ASSESSMENT AND PLAN: A 49 year old female with a history of diabetes, hypertension, hyperlipidemia, presenting with abdominal pain.  1.  Acute kidney injury. Intravenous fluid hydration with normal saline. Follow renal function. Avoid nephrotoxic agents including her ACE inhibitor.  2.  Hyponatremia, likely from GI losses. IV fluid with normal saline. Follow sodium trend.  3.  Mild transaminitis likely secondary to hepatic steatosis which is seen on CT but an outpatient follow-up.  4.  Source of leukocytosis tachycardia. No indication of infection. We will hold antibiotics. Possibly related to nonspecific enteritis.  5.  Epigastric pain.  Nonspecific enteritis on CT. We will add a proton pump inhibitor.  6.  Mildly elevated troponins in the setting of acute kidney injury. We will trend cardiac enzymes. Admit to telemetry.   7.  Type 2 diabetes. Hold oral agents. Add insulin sliding scale every 6 hours, Accu-Cheks.  8.  Hypertension. We will hold ACE inhibitor and hydrochlorothiazide given acute kidney injury and hyponatremia.  9.  Deep venous thrombosis prophylaxis with heparin subcutaneous.   CODE STATUS:  The patient is full code.   TIME SPENT: 45 minutes    ____________________________ Cletis Athensavid K. Hower, MD dkh:cc D: 09/29/2013 23:45:33 ET T: 09/30/2013 00:07:50 ET JOB#: [id number]  cc: Cletis Athensavid K. Hower, MD, <Dictator> DAVID Synetta ShadowK HOWER MD ELECTRONICALLY SIGNED 09/30/2013 1:07

## 2015-03-16 NOTE — H&P (Signed)
PATIENT NAME:  Krystal Patel, Krystal Patel MR#:  [id number] DATE OF BIRTH:  Aug 25, 1966  DATE OF ADMISSION:  10/28/2013  REFERRING PHYSICIAN: Dr. Carollee MassedKaminski.   PRIMARY CARE PHYSICIAN: Dr. Dario GuardianJadali.   CHIEF COMPLAINT: Generalized weakness, fatigue, elevated blood glucose.   This is a 49 year old female with history of type 2 diabetes, poorly controlled; hypertension, hyperlipidemia, presenting with generalized weakness, fatigue and elevated blood glucose that has been going on for a few weeks. She recently started on Levemir 20 units  subcutaneous injection daily approximately 1 week ago. Despite this, she is still having daily blood glucose readings measuring only as high on her glucometer. She has associated polyphagia, polyuria, polydipsia without fevers or chills, any recent sick contacts. Finally, at the insistence of her sisters, presented to the hospital for further workup and evaluation. She has also been complaining of nausea and decreased p.o. intake, however, no bouts of emesis.   REVIEW OF SYSTEMS:  CONSTITUTIONAL: Positive for fatigue and weakness.  Denies any fevers.  EYES: Denies blurred vision, double vision, eye pain.  EARS, NOSE, THROAT: Denies tinnitus, ear pain, hearing loss.  RESPIRATORY: Denies cough, wheeze, shortness of breath.  CARDIOVASCULAR: Denies chest pain, palpitations, edema.  GASTROINTESTINAL: Positive for nausea. Denies vomiting, diarrhea, abdominal pain.  GENITOURINARY: Positive for polyuria. Denies dysuria.  ENDOCRINE: Denies thyroid problems. Positive for polyuria, polydipsia and polyphagia, as described above.  HEMATOLOGIC AND LYMPHATICS:  Denies easy bruising or bleeding.  SKIN: Denies rash or lesions.  MUSCULOSKELETAL: Denies pain in neck, back, shoulder, knees, hips, any arthritic symptoms.  NEUROLOGIC: Denies any paralysis, paresthesias.  PSYCHIATRIC: Denies any anxiety or depressive symptoms. Otherwise, full review of systems performed by me is negative.   PAST  MEDICAL HISTORY: Type 2 diabetes, poorly-controlled; hypertension, hyperlipidemia, anxiety, hypothyroidism.   SOCIAL HISTORY: Social alcohol usage, every day tobacco usage.   FAMILY HISTORY: Hypertension and diabetes.   ALLERGIES: No known drug allergies.   HOME MEDICATIONS: Hydrochlorothiazide 25 mg p.o. daily, Levemir 20 units subcutaneous injection nightly, lisinopril 20 mg p.o. daily.   PHYSICAL EXAMINATION: VITAL SIGNS: Temperature 98.3, heart rate 88, respirations 18, blood pressure 151/78, saturating 95% on room air. Weight 90.7 kg, BMI 37.8.  GENERAL: Obese, well-developed female, who is currently in no acute distress.  HEAD: Normocephalic, atraumatic.  EYES: Pupils equal, round and reactive to light. Extraocular muscles intact. No scleral icterus.  MOUTH: Dry mucosal membranes. Dentition poor. No abscess is noted.  EAR, NOSE, THROAT:  Throat clear without exudates. No external lesions.  NECK: Supple. No thyromegaly. No nodules. No JVD.  PULMONARY: Clear to auscultation bilaterally. No wheezes, rales or rhonchi. No use of accessory muscles. Good respiratory effort.  CHEST: Nontender to palpation.  CARDIOVASCULAR: S1, S2, regular rate and rhythm. No murmurs, rubs or gallops. No edema. Pedal pulses 2+ bilaterally.  GASTROINTESTINAL: Soft, nontender, nondistended. No masses. Obese. Positive bowel sounds. No hepatosplenomegaly.  MUSCULOSKELETAL: No swelling, clubbing or edema. Range of motion full in all extremities.  NEUROLOGIC: Cranial nerves II through XII intact. Sensation intact. Reflexes are intact.  SKIN: No ulcerations, lesions, rashes or cyanosis. Skin warm, dry. Turgor is poor.  PSYCHIATRIC:  Mood and affect appears anxious. Awake, alert, oriented x 3. Insight and judgment poor, given history of poor medical compliance.   LABORATORY DATA: Sodium 108, potassium 4.6, chloride 70, bicarb 24, BUN 42, creatinine 2.37, glucose 1298. Total protein 8.3, albumin 3.7, bilirubin 1.4,  alkaline phosphatase 207, AST 55, ALT 64. WBC 5.9, hemoglobin 11.6, platelets of 134.   ASSESSMENT AND  PLAN: A 49 year old female with history of type 2 diabetes, poorly controlled, found to have markedly elevated blood glucose, as well as acute kidney injury.  1. Hyperosmolar, nonketotic hyperglycemia with type 2 diabetes, poorly controlled. IV fluid hydration with normal saline with 20 mEq of potassium at 250 mL an hour, insulin drip at 0.1 units/kg/hour, q.1 hour Accu-Cheks, q.4 hour BMP. Her goal blood glucose will be between 250 and 300. Once she reaches a blood glucose of 300, we can change her IV fluids to D5 half normal saline at 250 mL an hour, as well as decrease her insulin drip to 0.5 units/kg/hour.  Once she is able to tolerate p.o. and has a stable blood glucose, can transition over to long-acting insulin, turn the insulin drip off 1 hour after giving her medications. I suspect that she will need greater than her home dose of 20 units of Levemir. We will calculate the appropriate dosage.  2.  Acute kidney injury, prerenal, secondary to dehydration. IV fluids with normal saline. Avoid nephrotoxic agents including hydrochlorothiazide and lisinopril.  3.  Hyponatremia, corrected to 127. IV fluid hydration with normal saline. Follow sodium levels.  4.  Hypertension. Hold lisinopril and hydrochlorothiazide given acute kidney injury. Can use p.r.n. hydralazine if needed.  5.  Transaminitis, stable with known hepatic steatosis.  6.  Deep venous thrombosis prophylaxis with heparin subQ.   The patient is FULL CODE.   CRITICAL CARE TIME: 45 minutes    ____________________________ Cletis Athens. Hower, MD dkh:dmm D: 10/28/2013 21:38:54 ET T: 10/28/2013 22:06:51 ET JOB#: [id number]  cc: Cletis [place]. Hower, MD, <Dictator> DAVID Synetta Shadow MD ELECTRONICALLY SIGNED 10/29/2013 20:35

## 2015-03-17 NOTE — Discharge Summary (Signed)
PATIENT NAME:  Krystal Patel, Krystal Patel MR#:  [id number] DATE OF BIRTH:  [birth year]  DATE OF ADMISSION:  05/30/2014 DATE OF DISCHARGE:  05/31/2014  DISCHARGE DIAGNOSES: 1. Microcytic anemia secondary to menorrhagia with iron deficiency. 2. Insulin-dependent diabetes mellitus. 3. Chest pain.   4. Hypertension.  5. Tobacco abuse.   IMAGING STUDIES: Includes: 1. Chest x-ray which showed nothing acute.  2. Pelvic ultrasound showed nothing acute; benign appearing functional cyst in the right ovary.   ADMITTING HISTORY AND PHYSICAL: Please see detailed H and P dictated previously. A  49 year old obese female patient with history of diabetes, hypertension, tobacco abuse, who presented to the hospital with chest pain and fatigue. The patient was found to have severe anemia, 7.1, and admitted to the hospitalist service for rule out acute coronary syndrome and symptomatic anemia.   HOSPITAL COURSE: Microcytic anemia secondary to chronic blood loss. The patient was transfused 1 unit of packed RBCs in which her hemoglobin went up to 8.4. She has also been started on ferrous sulfate. The patient has had menorrhagia for 3 months for which she has a follow-up appointment with Dr. Valentino Saxonherry of gynecology on 06/01/2014. The patient felt significantly better with her fatigue resolved. No further chest pain. For her chest pain, the patient had troponins done 3 times which are normal. EKG showed nothing acute. The patient is being discharged home in a stable condition.   Prior to discharge, the patient has no shortness of breath or chest pain.   DISCHARGE MEDICATIONS: 1. Ferrous sulfate 325 mg oral 3 times a day with meals.  2. Hydrochlorothiazide 25 mg daily.  3. Insulin Levemir 30 units subcutaneous 2 times a day.  4. Toprol 20 mg daily.   DISCHARGE INSTRUCTIONS: Low-salt diabetic diet. Activity as tolerated. Follow up with gynecology on 06/01/2014.  Take iron pills and other medications as prescribed.   Time spend on  day of discharge in discharge activity was 42 minutes.    ___________________________ Molinda BailiffSrikar R. Margerie Fraiser, MD srs:dw D: 06/01/2014 15:43:54 ET T: 06/01/2014 20:39:27 ET JOB#: [id number]  cc: Wardell HeathSrikar R. Elpidio AnisSudini, MD, <Dictator> Orie FishermanSRIKAR R Casper Pagliuca MD ELECTRONICALLY SIGNED 06/06/2014 18:16

## 2015-03-17 NOTE — H&P (Signed)
PATIENT NAME:  Krystal Patel, DALPORTO MR#:  [id number] DATE OF BIRTH:  [birth year]  DATE OF ADMISSION:  06/19/2014  REFERRING PHYSICIAN: Dr. Marjean Donna.   PRIMARY CARE PHYSICIAN: [facility], Dr. Cristopher Peru.    GYNECOLOGIST: Dr. Marcelline Mates.   CHIEF COMPLAINT: Chest pain, shortness of breath, fatigue.   HISTORY OF PRESENT ILLNESS: This is a 49 year old female with known history of diabetes, hypertension, menorrhagia, chronic iron deficiency anemia, presents today with the above-mentioned complaints. Reports she is having left-sided chest pain, pressure-like quality, nonradiating, presenting at rest, was reproducible on palpation, started at 8:30 in the evening. As well, reports some mild shortness of breath with it. The patient was recently admitted to [facility] with similar presentation where she had negative cardiac enzymes, but she was transfused 1 unit of packed red blood cells secondary to anemia of iron deficiency secondary to menorrhagia. The patient reports she has been having significant menorrhagia since April 28th almost on a daily basis. Again, she was transfused 1 unit and discharged with hemoglobin of 8.4 on July 9th. Today, her hemoglobin is again 7. She denies any coffee-ground emesis, any bright red blood per rectum and melena. Reports she is still having significant menorrhagia. It did improve after she saw Dr. Marcelline Mates of GYN where she started her on contraception, Provera. Reports on average she is using 8 to 9 pads per day. Reported it started to decline. Today was 6 pads only. Hospitalist requested to admit the patient for further evaluation of her chest pain, as well due to need of transfusion for her symptomatic anemia. As well, the patient reports she ran out of her meds over the last 3 days.   PAST MEDICAL HISTORY:  1. Insulin-dependent diabetes mellitus.  2. Hypertension.  3. Hyperlipidemia.  4. Anxiety.  5. Hypothyroidism.  6. Tobacco abuse.  7. Morbid obesity.   SOCIAL  HISTORY: The patient smokes 5 cigarettes per day. Has history of heavy alcohol abuse in the past but quit drinking in November of last year. No illicit drug use.   FAMILY HISTORY: Significant for hypertension and diabetes.   CODE STATUS: FULL CODE.   ALLERGIES: No known drug allergies.   HOME MEDICATIONS: As mentioned earlier, the patient was out of her meds over the last 3 days, but she is supposed to be on hydrochlorothiazide 25 mg oral daily, lisinopril 10 mg oral daily, Levemir 30 units subcutaneous b.i.d. and she is on Provera for contraception.   REVIEW OF SYSTEMS:  CONSTITUTIONAL: Reports fatigue, weakness. Denies weight gain, weight loss, fever, chills.  EYES: Denies blurry vision, double vision or inflammation.  EARS, NOSE, THROAT: Denies tinnitus, ear pain, hearing loss, epistaxis.  RESPIRATORY: Denies cough, wheezing, hemoptysis. Reports mild shortness of breath.  CARDIOVASCULAR: Reports chest pain. No orthopnea. No edema. No palpitations.  GASTROINTESTINAL: Denies nausea, vomiting, diarrhea.  GENITOURINARY: Denies dysuria, hematuria, renal colic.  ENDOCRINE: Denies polyuria, polydipsia, heat or cold intolerance.  HEMATOLOGY: Reports history of anemia. Denies easy bruising, bleeding diathesis.  INTEGUMENTARY: Denies acne, rash or skin lesion.  MUSCULOSKELETAL: Denies any back pain, arthritis, cramps.  NEUROLOGIC: Denies any history of CVA, TIA, focal deficits, fatigue, weakness.  PSYCHIATRIC: Denies anxiety, insomnia or depression.  GYNECOLOGIC: Reports heavy menstrual bleeding over the last 3 months.   PHYSICAL EXAMINATION:  VITAL SIGNS: Temperature 98.6, pulse 58, respiratory rate 20, blood pressure 122/80, saturating 100% on room air.  GENERAL: Obese female, looks comfortable in bed, in no apparent distress.  HEENT: Head atraumatic, normocephalic. Pale conjunctivae. Anicteric sclerae. Moist  oral mucosa.  NECK: Supple. No thyromegaly. No JVD.  CHEST: Good air entry  bilaterally. No wheezing, rales, rhonchi.  CARDIOVASCULAR: S1, S2 heard. No rubs, murmurs or gallops.  ABDOMEN: Soft, nontender, nondistended. Bowel sounds present.  EXTREMITIES: No edema. No clubbing. No cyanosis. Pedal and radial pulses felt bilaterally.  PSYCHIATRIC: Appropriate affect. Awake, alert x 3. Intact judgment and insight.  NEUROLOGIC: Cranial nerves grossly intact. Motor 5 out of 5. No focal deficits.  MUSCULOSKELETAL: No joint effusion or erythema.   PERTINENT LABORATORIES: Glucose 152, BUN 12, creatinine 1.03, sodium 140, potassium 3.6, chloride 108. ALT 18, AST 16, alk phos 75. Troponin less than 0.02. White blood cells 5.8, hemoglobin 7, hematocrit 22.2, platelets 236. INR is 1.   CHEST X-RAY: No active disease.   ASSESSMENT AND PLAN:  1. Chest pain/shortness of breath: Chest pain appears to be noncardiac, atypical, reproducible by palpation. The patient has no EKG changes and negative troponin. Received aspirin in the Emergency Department. Will admit to telemetry unit. Will cycle her cardiac enzymes, follow the trend. As well, shortness of breath might be his related to her symptomatic anemia.  2. Anemia: Symptomatic with generalized weakness and shortness of breath. This is secondary to menorrhagia. Will transfuse packed red blood cells. Will continue her back on her supplement. The patient was recently started on Provera by her gynecologist which will be continued.  3. Insulin-dependent diabetes mellitus: The patient will be started on Lantus but at a lower dose. Will add insulin sliding scale.  4. Tobacco abuse: The patient was counseled. Will be started on nicotine patch.  5. Hypertension: She will be resumed back on her home medications.  6. Medication noncompliance: The patient was counseled.  7. Deep vein thrombosis prophylaxis: Sequential compression device and thromboembolic deterrent hose.   CODE STATUS: FULL CODE.   TOTAL TIME SPENT ON ADMISSION AND PATIENT CARE:  50 minutes.   ____________________________ Albertine Patricia, MD dse:gb D: 06/19/2014 01:21:18 ET T: 06/19/2014 02:06:20 ET JOB#: [id number]  cc: Albertine Patricia, MD, <Dictator> Aritha Huckeba Graciela Husbands MD ELECTRONICALLY SIGNED 06/19/2014 7:23

## 2015-03-17 NOTE — H&P (Signed)
PATIENT NAME:  Krystal Patel, [name] MR#:  [id number] DATE OF BIRTH:  October 17, 1966  DATE OF ADMISSION:  05/30/2014  PRIMARY CARE PHYSICIAN: Dr. Wilmon ArmsMachado   GYNECOLOGIST: Dr. Valentino Saxonherry   CHIEF COMPLAINT: Chest pain and fatigue.   HISTORY OF PRESENT ILLNESS: This is a 49 year old obese female patient with history of diabetes, hypertension, menorrhagia, and chronic iron deficiency anemia who presents to the Emergency Room complaining of acute midsternal chest pain. The patient works as a Building services engineercar part inspector. She was sitting at her desk when suddenly she had midsternal pain which lasted about 7 to 8 minutes, associated with some shortness of breath. This pain reappeared in a few minutes. She did not exert. No aggravating or relieving factors. The patient presented to the ER. Has been chest pain-free. Here, the patient's EKG shows LVH without any ST elevation or depression. Cardiac enzymes are normal, but on her lab studies her hemoglobin has been found to be 7.1. Last hemoglobin 1 month prior was 10.9. The patient has had menorrhagia with bleeding every day for about 90 days. She is supposed to see gynecology on 06/01/2014, in 2 days, with Dr. Valentino Saxonherry.   The patient has not received any blood transfusions previously. She does not have any PND, orthopnea, edema. No premature coronary artery disease in the family, but she does smoke.   PAST MEDICAL HISTORY: 1.  Insulin dependent diabetes mellitus.  2.  Hypertension.  3.  Hyperlipidemia.  4.  Anxiety.  5.  Hypothyroidism.  6.  Tobacco abuse.  7.  Morbid obesity.   SOCIAL HISTORY: Smokes a pack a day. Rare alcohol use. No illicit drug use. 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  CODE STATUS: FULL code.   FAMILY HISTORY: Hypertension and diabetes. Her grandmother and mother had heart attack, but no premature CAD in the family.   ALLERGIES: No known drug allergies.   HOME MEDICATIONS: 1.  Hydrochlorothiazide 25 mg daily. 2.  Lisinopril 10 mg daily.  3.  Levemir 30  units subcutaneous 2 times a day.   REVIEW OF SYSTEMS: CONSTITUTIONAL: Complains of some fatigue. No weight loss, weight gain.  EYES: No blurred vision or blurry vision. ENT: No tinnitus, ear pain, hearing loss.  RESPIRATORY: No cough, wheeze, hemoptysis.  CARDIOVASCULAR: Has chest pain. No orthopnea, edema.  GASTROINTESTINAL: No nausea, vomiting, diarrhea, abdominal pain.  GENITOURINARY: No dysuria, hematuria, or frequency.  ENDOCRINE: No polyuria, nocturia or thyroid problems.  HEMATOLOGIC AND LYMPHATIC: No anemia, easy bruising, bleeding.  INTEGUMENTARY: No acne, rash, lesion.  MUSCULOSKELETAL: No back pain, arthritis.  NEUROLOGIC: No focal numbness, weakness, seizures. Does have some dizziness on and off. PSYCHIATRY: Has anxiety.   PHYSICAL EXAMINATION: VITAL SIGNS: Temperature 98.4, pulse 77, blood pressure 130/67, saturation 100% on room air.  GENERAL: Morbidly obese female patient sitting up in bed, seems comfortable, conversational, cooperative with exam.  PSYCHIATRIC: Alert and oriented x3. Mood and affect appropriate. Judgment intact.  HEENT: Atraumatic, normocephalic. Oral mucosa moist and pink. External ears and nose normal. Pallor positive. No icterus. Pupils bilaterally equal and reactive to light. NECK: Supple. No thyromegaly or palpable lymph nodes. Trachea midline. No carotid bruit. No JVD.  CARDIOVASCULAR: S1, S2 without any murmurs. Peripheral pulses 2+. No edema.  RESPIRATORY: Normal work of breathing. Clear to auscultation on both sides.  GASTROINTESTINAL: Soft abdomen, nontender. Bowel sounds present. No hepatosplenomegaly palpable. SKIN: Warm and dry. No petechiae, rash or ulcer. MUSCULOSKELETAL: No joint tenderness in large joints. Normal muscle tone.  NEUROLOGICAL: Motor strength 5/5 in upper extremities.  Sensation is intact all over.  LYMPHATIC: No cervical lymphadenopathy.   DIAGNOSTIC DATA: Lab studies show glucose of 154, BNP of 46, BUN 13, creatinine 0.83,  sodium 135, and potassium 4.3 with GFR greater than 60. Troponin less than 0.02.   WBC 9.4 and hemoglobin 7.1 with platelets of 202,000 and MCV of 78.   EKG shows LVH without strain pattern. Normal sinus. No acute ST elevation found.   Chest x-ray, PA and lateral, shows no acute cardiopulmonary disease.   ASSESSMENT AND PLAN: 1.  Chest pain at rest in a patient with risk factors of hypertension, diabetes, and tobacco abuse. I suspect her pain is likely secondary from the severe anemia she has. This has 3 units in the last month secondary to the menorrhagia the patient has. We will check 2 more sets of cardiac enzymes and try to correct her anemia at this point, but if she continues to have chest pain in spite of correction of anemia she will need stress test scheduled as outpatient. Presently, we will rule out acute coronary syndrome with cardiac enzymes. Would not start any aspirin at this point with her bleeding going on. Will check a fasting lipid profile.  2.  Microcytic anemia secondary to chronic blood loss from menorrhagia. I will obtain consent for transfusion on this patient with hemoglobin of 7.1 secondary to symptomatic anemia. Will give a unit of packed RBC. She does have follow up with gynecology in 2 days, on 06/01/2014, with Dr. Valentino Saxon.  3.  Insulin-dependent diabetes mellitus. Continue the patient's home dose of Levemir. Put her on sliding scale insulin.  4.  Hypertension. Continue hydrochlorothiazide and lisinopril. 5.  Deep vein thrombosis prophylaxis with SCDs. 6. Tobacco abuse- Counselled to quit smoking > 3 minutes spent.  CODE STATUS: FULL code.   TIME SPENT: Today on this case was 40 minutes.  ____________________________ Molinda Bailiff Yvett Rossel, MD srs:sb D: 05/30/2014 14:15:35 ET T: 05/30/2014 14:38:44 ET JOB#: [id number]  cc: Wardell Heath R. Shoua Ressler, MD, <Dictator> Anika S. Valentino Saxon, MD Dr. Gilford Rile MD ELECTRONICALLY SIGNED 06/06/2014 18:15

## 2015-03-17 NOTE — Discharge Summary (Signed)
PATIENT NAME:  Krystal Patel, Krystal Patel MR#:  [id number] DATE OF BIRTH:  08-17-1966  DATE OF ADMISSION:  06/19/2014 DATE OF DISCHARGE:  06/19/2014  PATIENT'S PRIMARY CARE PHYSICIAN: [facility], Dr. Wilmon ArmsMachado.   GYNECOLOGIST: Anika S. Valentino Saxonherry, MD  FINAL DIAGNOSES:  1.  Symptomatic anemia, blood loss anemia, iron deficiency.  2.  Pica ice eating.  3.  Hypertension.  4.  Diabetes.  5.  Obesity.  6.  Tobacco abuse.   MEDICATIONS ON DISCHARGE: Include lisinopril 20 mg daily, hydrochlorothiazide 25 mg daily, insulin detemir 30 units subcutaneous injection twice a day, ferrous sulfate 325 mg 3 times a day, nicotine patch 21 mg per transdermal film 1 patch daily, Colace 100 mg twice a day as needed for constipation, Provera 10 mg once a day.   DIET: Low-sodium diet, carbohydrate-controlled diet, regular consistency.  FOLLOWUP: Keep appointment with Dr. Valentino Saxonherry, gynecologist, on Thursday; 1-2 weeks with your medical doctor.   HOSPITAL COURSE: The patient was admitted and discharged same day, July 27th. Came in with chest pain, shortness of breath.  LABORATORY AND RADIOLOGICAL DATA DURING THE HOSPITAL COURSE: Included 3 cardiac enzymes negative. EKG: Normal sinus rhythm, left atrial enlargement, left ventricular hypertrophy. Glucose 152, BUN 12, creatinine 1.03, sodium 140, potassium 3.6, chloride 108, CO2 of 25, calcium 9.0. Liver function tests normal range. INR 1.0. BNP 444. White blood cell count 5.8, H and H 7.0 and 22.2, platelet count of 236,000. Troponin x 3 negative. Chest x-ray: No active disease. Ferritin 10, TIBC 430, iron serum 50, iron saturation 12%. Repeat hemoglobin after 2 units of blood 9.2.  HOSPITAL COURSE PER PROBLEM LIST:  1.  For the patient's symptomatic blood loss anemia, iron deficiency, patient was given 2 units of packed red blood cells. Patient has been having vaginal bleeding going on since April. I advised now that we gave a transfusion of blood that she should consider a  hysterectomy. She has a followup appointment with Dr. Valentino Saxonherry on Thursday for IUD implant. I advised that she has to take her Provera up until that point. I also gave an iron infusion. Must continue iron 3 times a day. If the patient decides against hysterectomy, can be referred over to the cancer center for iron infusions and closer monitoring of blood counts.  2.  Pica ice eating. I advised to stop ice eating. I have seen patients with ice eating that have severe iron deficiency anemias. 3.  Hypertension. Continue current medications.  4.  Diabetes. On Levemir.  5.  Obesity. Weight loss needed.  6.  Tobacco abuse. Nicotine patch prescribed.  TIME SPENT ON DISCHARGE: 35 minutes.    ____________________________ Herschell Dimesichard J. Renae GlossWieting, MD rjw:sk D: 06/19/2014 14:06:20 ET T: 06/20/2014 01:51:31 ET JOB#: [id number]  cc: Herschell Dimesichard J. Renae GlossWieting, MD, <Dictator> Anika S. Valentino Saxonherry, MD Dr. Molinda BailiffMachado  Maryssa Giampietro J Astin Sayre MD ELECTRONICALLY SIGNED 06/22/2014 15:45

## 2018-06-16 ENCOUNTER — Other Ambulatory Visit: Payer: Self-pay

## 2018-06-16 ENCOUNTER — Encounter: Payer: Self-pay | Admitting: Emergency Medicine

## 2018-06-16 ENCOUNTER — Emergency Department
Admission: EM | Admit: 2018-06-16 | Discharge: 2018-06-16 | Disposition: A | Discharge status: Home / Self Care | Payer: Self-pay | Attending: Emergency Medicine | Admitting: Emergency Medicine

## 2018-06-16 DIAGNOSIS — R350 Frequency of micturition: Secondary | ICD-10-CM | POA: Insufficient documentation

## 2018-06-16 DIAGNOSIS — R631 Polydipsia: Secondary | ICD-10-CM | POA: Insufficient documentation

## 2018-06-16 DIAGNOSIS — Z5321 Procedure and treatment not carried out due to patient leaving prior to being seen by health care provider: Secondary | ICD-10-CM | POA: Insufficient documentation

## 2018-06-16 DIAGNOSIS — R61 Generalized hyperhidrosis: Secondary | ICD-10-CM | POA: Insufficient documentation

## 2018-06-16 HISTORY — DX: Essential (primary) hypertension: I10

## 2018-06-16 HISTORY — DX: Type 2 diabetes mellitus without complications: E11.9

## 2018-06-16 LAB — URINALYSIS, COMPLETE (UACMP) WITH MICROSCOPIC
Bacteria, UA: NONE SEEN
Bilirubin Urine: NEGATIVE
Hgb urine dipstick: NEGATIVE
KETONES UR: NEGATIVE mg/dL
Leukocytes, UA: NEGATIVE
NITRITE: NEGATIVE
PH: 5 (ref 5.0–8.0)
Protein, ur: 30 mg/dL — AB
SPECIFIC GRAVITY, URINE: 1.031 — AB (ref 1.005–1.030)
Squamous Epithelial / LPF: NONE SEEN (ref 0–5)

## 2018-06-16 LAB — COMPREHENSIVE METABOLIC PANEL
ALK PHOS: 165 U/L — AB (ref 38–126)
ALT: 31 U/L (ref 0–44)
AST: 24 U/L (ref 15–41)
Albumin: 4.3 g/dL (ref 3.5–5.0)
Anion gap: 10 (ref 5–15)
BUN: 15 mg/dL (ref 6–20)
CALCIUM: 10.4 mg/dL — AB (ref 8.9–10.3)
CO2: 22 mmol/L (ref 22–32)
CREATININE: 0.73 mg/dL (ref 0.44–1.00)
Chloride: 101 mmol/L (ref 98–111)
Glucose, Bld: 548 mg/dL (ref 70–99)
Potassium: 3.6 mmol/L (ref 3.5–5.1)
Sodium: 133 mmol/L — ABNORMAL LOW (ref 135–145)
Total Bilirubin: 1.1 mg/dL (ref 0.3–1.2)
Total Protein: 8 g/dL (ref 6.5–8.1)

## 2018-06-16 LAB — CBC
HCT: 39.7 % (ref 35.0–47.0)
HEMOGLOBIN: 13.5 g/dL (ref 12.0–16.0)
MCH: 28.4 pg (ref 26.0–34.0)
MCHC: 33.9 g/dL (ref 32.0–36.0)
MCV: 83.9 fL (ref 80.0–100.0)
Platelets: 170 10*3/uL (ref 150–440)
RBC: 4.74 MIL/uL (ref 3.80–5.20)
RDW: 15.8 % — ABNORMAL HIGH (ref 11.5–14.5)
WBC: 6.5 10*3/uL (ref 3.6–11.0)

## 2018-06-16 LAB — GLUCOSE, CAPILLARY: Glucose-Capillary: 531 mg/dL (ref 70–99)

## 2018-06-16 NOTE — ED Notes (Signed)
No answer when called several times from lobby 

## 2018-06-16 NOTE — ED Triage Notes (Signed)
Pt states she thinks her fsbs is high, did not check it at home. States has had increased urination, sweats, and thirst for 3 weeks.

## 2018-06-17 ENCOUNTER — Telehealth: Payer: Self-pay | Admitting: Emergency Medicine

## 2018-06-17 NOTE — Telephone Encounter (Addendum)
Called patient due to lwot to inquire about condition and follow up plans. No answer and no voicemail.  Called again and still no answer and no voicemail.

## 2018-07-30 ENCOUNTER — Emergency Department
Admission: EM | Admit: 2018-07-30 | Discharge: 2018-07-30 | Disposition: A | Discharge status: Home / Self Care | Payer: Self-pay | Attending: Emergency Medicine | Admitting: Emergency Medicine

## 2018-07-30 ENCOUNTER — Encounter: Payer: Self-pay | Admitting: Emergency Medicine

## 2018-07-30 DIAGNOSIS — I10 Essential (primary) hypertension: Secondary | ICD-10-CM | POA: Insufficient documentation

## 2018-07-30 DIAGNOSIS — E1165 Type 2 diabetes mellitus with hyperglycemia: Secondary | ICD-10-CM | POA: Insufficient documentation

## 2018-07-30 DIAGNOSIS — R739 Hyperglycemia, unspecified: Secondary | ICD-10-CM

## 2018-07-30 DIAGNOSIS — D696 Thrombocytopenia, unspecified: Secondary | ICD-10-CM | POA: Insufficient documentation

## 2018-07-30 LAB — URINALYSIS, COMPLETE (UACMP) WITH MICROSCOPIC
BACTERIA UA: NONE SEEN
Bilirubin Urine: NEGATIVE
Glucose, UA: >=500 mg/dL — AB
HGB URINE DIPSTICK: NEGATIVE
Ketones, ur: NEGATIVE mg/dL
LEUKOCYTES UA: NEGATIVE
Nitrite: NEGATIVE
PH: 7 (ref 5.0–8.0)
Protein, ur: NEGATIVE mg/dL
SPECIFIC GRAVITY, URINE: 1.031 — AB (ref 1.005–1.030)

## 2018-07-30 LAB — BASIC METABOLIC PANEL
Anion gap: 9 (ref 5–15)
BUN: 12 mg/dL (ref 6–20)
CO2: 27 mmol/L (ref 22–32)
CREATININE: 0.79 mg/dL (ref 0.44–1.00)
Calcium: 10.2 mg/dL (ref 8.9–10.3)
Chloride: 98 mmol/L (ref 98–111)
GFR calc non Af Amer: >60 mL/min (ref >60)
Glucose, Bld: 596 mg/dL (ref 70–99)
POTASSIUM: 4.9 mmol/L (ref 3.5–5.1)
Sodium: 134 mmol/L — ABNORMAL LOW (ref 135–145)

## 2018-07-30 LAB — CBC
HEMATOCRIT: 41.7 % (ref 35.0–47.0)
Hemoglobin: 13.8 g/dL (ref 12.0–16.0)
MCH: 28.2 pg (ref 26.0–34.0)
MCHC: 33 g/dL (ref 32.0–36.0)
MCV: 85.5 fL (ref 80.0–100.0)
PLATELETS: 146 10*3/uL — AB (ref 150–440)
RBC: 4.88 MIL/uL (ref 3.80–5.20)
RDW: 15.2 % — ABNORMAL HIGH (ref 11.5–14.5)
WBC: 5.2 10*3/uL (ref 3.6–11.0)

## 2018-07-30 LAB — GLUCOSE, CAPILLARY
GLUCOSE-CAPILLARY: 538 mg/dL — AB (ref 70–99)
Glucose-Capillary: 338 mg/dL — ABNORMAL HIGH (ref 70–99)

## 2018-07-30 LAB — POCT PREGNANCY, URINE: Preg Test, Ur: NEGATIVE

## 2018-07-30 MED ORDER — METFORMIN HCL 1000 MG PO TABS: 1000.0000 mg | ORAL_TABLET | Freq: Two times a day (BID) | ORAL | 0 refills | Status: AC

## 2018-07-30 MED ORDER — INSULIN ASPART 100 UNIT/ML ~~LOC~~ SOLN
5.0000 [IU] | Freq: Once | SUBCUTANEOUS | Status: AC
  Administered 2018-07-30: 5 [IU] via INTRAVENOUS
  Filled 2018-07-30: qty 1

## 2018-07-30 MED ORDER — LISINOPRIL-HYDROCHLOROTHIAZIDE 10-12.5 MG PO TABS: 1.0000 | ORAL_TABLET | Freq: Every day | ORAL | 0 refills | Status: AC

## 2018-07-30 MED ORDER — SODIUM CHLORIDE 0.9 % IV BOLUS
1000.0000 mL | Freq: Once | INTRAVENOUS | Status: AC
  Administered 2018-07-30: 1000 mL via INTRAVENOUS

## 2018-07-30 NOTE — ED Provider Notes (Signed)
[facility] Emergency Department Provider Note ____________________________________________   First MD Initiated Contact with Patient 07/30/18 1625     (approximate)  I have reviewed the triage vital signs and the nursing notes.   HISTORY  Chief Complaint Hyperglycemia and Hypertension  HPI Krystal Patel is a 52 y.o. female with a history of diabetes and hypertension was been off of her lisinopril as well as metformin for the past 3 weeks.  She says she is recently moved from [place] and does not have a doctor here.  She says that she feels like her sugar is high.  By this she says that she has some intermittent body aches as well as blurred vision and excessive thirst.  No nausea or vomiting or abdominal pain.   Past Medical History:  Diagnosis Date  . Diabetes mellitus without complication (HCC)   . Hypertension     There are no active problems to display for this patient.   History reviewed. No pertinent surgical history.  Prior to Admission medications   Not on File    Allergies Patient has no known allergies.  No family history on file.  Social History Social History   Tobacco Use  . Smoking status: Never Smoker  . Smokeless tobacco: Never Used  Substance Use Topics  . Alcohol use: Not on file  . Drug use: Not on file    Review of Systems  Constitutional: No fever/chills Eyes: No visual changes. ENT: No sore throat. Cardiovascular: Denies chest pain. Respiratory: Denies shortness of breath. Gastrointestinal: No abdominal pain.  No nausea, no vomiting.  No diarrhea.  No constipation. Genitourinary: Negative for dysuria. Musculoskeletal: Negative for back pain. Skin: Negative for rash. Neurological: Negative for headaches, focal weakness or numbness.   ____________________________________________   PHYSICAL EXAM:  VITAL SIGNS: ED Triage Vitals [07/30/18 1611]  Enc Vitals Group     BP (!) 172/83     Pulse Rate 80    Resp 18     Temp 99 F (37.2 C)     Temp Source Oral     SpO2 95 %     Weight 198 lb 10.2 oz (90.1 kg)     Height 5\' 1"  (1.549 m)     Head Circumference      Peak Flow      Pain Score 6     Pain Loc      Pain Edu?      Excl. in GC?     Constitutional: Alert and oriented. Well appearing and in no acute distress. Eyes: Conjunctivae are normal.  Head: Atraumatic. Nose: No congestion/rhinnorhea. Mouth/Throat: Mucous membranes are moist.  Neck: No stridor.   Cardiovascular: Normal rate, regular rhythm. Grossly normal heart sounds.  Respiratory: Normal respiratory effort.  No retractions. Lungs CTAB. Gastrointestinal: Soft and nontender. No distention. Musculoskeletal: No lower extremity tenderness nor edema.  No joint effusions. Neurologic:  Normal speech and language. No gross focal neurologic deficits are appreciated. Skin:  Skin is warm, dry and intact. No rash noted. Psychiatric: Mood and affect are normal. Speech and behavior are normal.  ____________________________________________   LABS (all labs ordered are listed, but only abnormal results are displayed)  Labs Reviewed  BASIC METABOLIC PANEL - Abnormal; Notable for the following components:      Result Value   Sodium 134 (*)    Glucose, Bld 596 (*)    All other components within normal limits  CBC - Abnormal; Notable for the following components:   RDW  15.2 (*)    Platelets 146 (*)    All other components within normal limits  URINALYSIS, COMPLETE (UACMP) WITH MICROSCOPIC - Abnormal; Notable for the following components:   Color, Urine COLORLESS (*)    APPearance CLEAR (*)    Specific Gravity, Urine 1.031 (*)    Glucose, UA >=500 (*)    All other components within normal limits  GLUCOSE, CAPILLARY - Abnormal; Notable for the following components:   Glucose-Capillary 538 (*)    All other components within normal limits  GLUCOSE, CAPILLARY - Abnormal; Notable for the following components:   Glucose-Capillary  338 (*)    All other components within normal limits  CBG MONITORING, ED  POC URINE PREG, ED  POCT PREGNANCY, URINE   ____________________________________________  EKG   ____________________________________________  RADIOLOGY   ____________________________________________   PROCEDURES  Procedure(s) performed:   Procedures  Critical Care performed:   ____________________________________________   INITIAL IMPRESSION / ASSESSMENT AND PLAN / ED COURSE  Pertinent labs & imaging results that were available during my care of the patient were reviewed by me and considered in my medical decision making (see chart for details).  DDX: Medication noncompliance, hyperglycemia, DKA, dehydration, acute renal injury As part of my medical decision making, I reviewed the following data within the electronic MEDICAL RECORD NUMBER Notes from prior ED visits  ----------------------------------------- 6:10 PM on 07/30/2018 -----------------------------------------  Patient with glucose of 338 at this time after fluids as well as aspart.  She will be discharged with her home metformin, 1000 mg twice daily.  Also will be discharged with her combination lisinopril HCT Z pill of 10 mg and 12.5 mg respectively.  She will need to establish primary care.  Also aware of the low platelets.  She will be given multiple follow-up numbers for primary care.  She is understanding the diagnosis as well as treatment and willing to comply. ____________________________________________   FINAL CLINICAL IMPRESSION(S) / ED DIAGNOSES  Hyperglycemia.  Hypertension.  Thrombus cytopenia.  NEW MEDICATIONS STARTED DURING THIS VISIT:  New Prescriptions   No medications on file     Note:  This document was prepared using Dragon voice recognition software and may include unintentional dictation errors.     Myrna Blazer, MD 07/30/18 [phone]

## 2018-07-30 NOTE — ED Triage Notes (Signed)
Patient is diabetic and takes 100mg  metformin 2x day and takes lisinopril for blood pressure.  Patient has been out of her metformin for 3 weeks and has been out of her blood pressure medication x 1 week.  Patient states, "I feel like my blood sugar is high."  Patient states she has no way of checking her blood sugar.  This RN gave patient information about [facility] and [facility]ication [facility].

## 2018-08-25 ENCOUNTER — Encounter: Payer: Self-pay | Admitting: Emergency Medicine

## 2018-08-25 ENCOUNTER — Emergency Department
Admission: EM | Admit: 2018-08-25 | Discharge: 2018-08-25 | Disposition: A | Discharge status: Home / Self Care | Payer: Self-pay | Attending: Student in an Organized Health Care Education/Training Program | Admitting: Student in an Organized Health Care Education/Training Program

## 2018-08-25 ENCOUNTER — Emergency Department: Payer: Self-pay

## 2018-08-25 DIAGNOSIS — R079 Chest pain, unspecified: Secondary | ICD-10-CM | POA: Insufficient documentation

## 2018-08-25 DIAGNOSIS — Z79899 Other long term (current) drug therapy: Secondary | ICD-10-CM | POA: Insufficient documentation

## 2018-08-25 DIAGNOSIS — I10 Essential (primary) hypertension: Secondary | ICD-10-CM | POA: Insufficient documentation

## 2018-08-25 DIAGNOSIS — E119 Type 2 diabetes mellitus without complications: Secondary | ICD-10-CM | POA: Insufficient documentation

## 2018-08-25 LAB — BASIC METABOLIC PANEL
ANION GAP: 10 (ref 5–15)
BUN: 19 mg/dL (ref 6–20)
CALCIUM: 10.3 mg/dL (ref 8.9–10.3)
CO2: 27 mmol/L (ref 22–32)
CREATININE: 0.59 mg/dL (ref 0.44–1.00)
Chloride: 99 mmol/L (ref 98–111)
GFR calc non Af Amer: >60 mL/min (ref >60)
Glucose, Bld: 412 mg/dL — ABNORMAL HIGH (ref 70–99)
POTASSIUM: 3.9 mmol/L (ref 3.5–5.1)
SODIUM: 136 mmol/L (ref 135–145)

## 2018-08-25 LAB — TROPONIN I: Troponin I: <0.03 ng/mL (ref <0.03)

## 2018-08-25 LAB — CBC
HCT: 40.8 % (ref 35.0–47.0)
Hemoglobin: 14 g/dL (ref 12.0–16.0)
MCH: 29.4 pg (ref 26.0–34.0)
MCHC: 34.4 g/dL (ref 32.0–36.0)
MCV: 85.3 fL (ref 80.0–100.0)
Platelets: 168 10*3/uL (ref 150–440)
RBC: 4.78 MIL/uL (ref 3.80–5.20)
RDW: 14.8 % — AB (ref 11.5–14.5)
WBC: 5 10*3/uL (ref 3.6–11.0)

## 2018-08-25 MED ORDER — LORAZEPAM 0.5 MG PO TABS: 0.5000 mg | ORAL_TABLET | Freq: Four times a day (QID) | ORAL | 0 refills | Status: AC | PRN

## 2018-08-25 MED ORDER — LORAZEPAM 0.5 MG PO TABS
ORAL_TABLET | ORAL | Status: AC
  Filled 2018-08-25: qty 1

## 2018-08-25 MED ORDER — HYDROCODONE-ACETAMINOPHEN 5-325 MG PO TABS
1.0000 | ORAL_TABLET | Freq: Once | ORAL | Status: AC
  Administered 2018-08-25: 1 via ORAL
  Filled 2018-08-25: qty 1

## 2018-08-25 MED ORDER — LORAZEPAM 0.5 MG PO TABS
0.5000 mg | ORAL_TABLET | Freq: Once | ORAL | Status: AC
  Administered 2018-08-25: 0.5 mg via ORAL

## 2018-08-25 NOTE — ED Triage Notes (Signed)
Patient presents to the ED with left sided chest pain that began this morning.  Patient states pain feels sharp and is intermittent.  Patient denies shortness of breath.

## 2018-08-25 NOTE — ED Provider Notes (Signed)
[facility] Emergency Department Provider Note    First MD Initiated Contact with Patient 08/25/18 1445     (approximate)  I have reviewed the triage vital signs and the nursing notes.   HISTORY  Chief Complaint Chest Pain    HPI Krystal Patel is a 52 y.o. female with a history of diabetes and hypertension presents the ER with chest pain started this morning describes it as stabbing and intermittent.  Does not radiate.  No associated diaphoresis or shortness of breath.  States that she does feel stressed sometimes.  States that she had cardiac work-up in [place] with cardiac cath that was reportedly "normal" this year.    Past Medical History:  Diagnosis Date  . Diabetes mellitus without complication (HCC)   . Hypertension    No family history on file. History reviewed. No pertinent surgical history. There are no active problems to display for this patient.     Prior to Admission medications   Medication Sig Start Date End Date Taking? Authorizing Provider  lisinopril-hydrochlorothiazide (ZESTORETIC) 10-12.5 MG tablet Take 1 tablet by mouth daily. 07/30/18 07/30/19  Schaevitz, Myra Rude, MD  LORazepam (ATIVAN) 0.5 MG tablet Take 1 tablet (0.5 mg total) by mouth every 6 (six) hours as needed for anxiety. 08/25/18 08/25/19  Willy Eddy, MD  metFORMIN (GLUCOPHAGE) 1000 MG tablet Take 1 tablet (1,000 mg total) by mouth 2 (two) times daily with a meal. 07/30/18 08/29/18  Schaevitz, Myra Rude, MD    Allergies Patient has no known allergies.    Social History Social History   Tobacco Use  . Smoking status: Never Smoker  . Smokeless tobacco: Never Used  Substance Use Topics  . Alcohol use: Not on file  . Drug use: Not on file    Review of Systems Patient denies headaches, rhinorrhea, blurry vision, numbness, shortness of breath, chest pain, edema, cough, abdominal pain, nausea, vomiting, diarrhea, dysuria, fevers, rashes or  hallucinations unless otherwise stated above in HPI. ____________________________________________   PHYSICAL EXAM:  VITAL SIGNS: Vitals:   08/25/18 1310 08/25/18 1500  BP: (!) 166/93 (!) 157/91  Pulse: 62 (!) 58  Resp: 18   Temp: 98.5 F (36.9 C)   SpO2: 98% 96%    Constitutional: Alert and oriented.  Eyes: Conjunctivae are normal.  Head: Atraumatic. Nose: No congestion/rhinnorhea. Mouth/Throat: Mucous membranes are moist.   Neck: No stridor. Painless ROM.  Cardiovascular: Normal rate, regular rhythm. Grossly normal heart sounds.  Good peripheral circulation. Respiratory: Normal respiratory effort.  No retractions. Lungs CTAB. Gastrointestinal: Soft and nontender. No distention. No abdominal bruits. No CVA tenderness. Genitourinary:  Musculoskeletal: No lower extremity tenderness nor edema.  No joint effusions. Neurologic:  Normal speech and language. No gross focal neurologic deficits are appreciated. No facial droop Skin:  Skin is warm, dry and intact. No rash noted. Psychiatric: Mood and affect are normal. Speech and behavior are normal.  ____________________________________________   LABS (all labs ordered are listed, but only abnormal results are displayed)  Results for orders placed or performed during the hospital encounter of 08/25/18 (from the past 24 hour(s))  Basic metabolic panel     Status: Abnormal   Collection Time: 08/25/18  1:18 PM  Result Value Ref Range   Sodium 136 135 - 145 mmol/L   Potassium 3.9 3.5 - 5.1 mmol/L   Chloride 99 98 - 111 mmol/L   CO2 27 22 - 32 mmol/L   Glucose, Bld 412 (H) 70 - 99 mg/dL   BUN  19 6 - 20 mg/dL   Creatinine, Ser 1.61 0.44 - 1.00 mg/dL   Calcium 09.6 8.9 - 04.5 mg/dL   GFR calc non Af Amer >60 >60 mL/min   GFR calc Af Amer >60 >60 mL/min   Anion gap 10 5 - 15  CBC     Status: Abnormal   Collection Time: 08/25/18  1:18 PM  Result Value Ref Range   WBC 5.0 3.6 - 11.0 K/uL   RBC 4.78 3.80 - 5.20 MIL/uL    Hemoglobin 14.0 12.0 - 16.0 g/dL   HCT 40.9 81.1 - 91.4 %   MCV 85.3 80.0 - 100.0 fL   MCH 29.4 26.0 - 34.0 pg   MCHC 34.4 32.0 - 36.0 g/dL   RDW 78.2 (H) 95.6 - 21.3 %   Platelets 168 150 - 440 K/uL  Troponin I     Status: None   Collection Time: 08/25/18  1:18 PM  Result Value Ref Range   Troponin I <0.03 <0.03 ng/mL  Troponin I     Status: None   Collection Time: 08/25/18  4:08 PM  Result Value Ref Range   Troponin I <0.03 <0.03 ng/mL   ____________________________________________  EKG My review and personal interpretation at Time: 13:15   Indication: chest pain  Rate: 60  Rhythm: sinus Axis: normal Other: normal intervals, no stemi ____________________________________________  RADIOLOGY  I personally reviewed all radiographic images ordered to evaluate for the above acute complaints and reviewed radiology reports and findings.  These findings were personally discussed with the patient.  Please see medical record for radiology report.  ____________________________________________   PROCEDURES  Procedure(s) performed:  Procedures    Critical Care performed: no ____________________________________________   INITIAL IMPRESSION / ASSESSMENT AND PLAN / ED COURSE  Pertinent labs & imaging results that were available during my care of the patient were reviewed by me and considered in my medical decision making (see chart for details).   DDX: ACS, pericarditis, esophagitis, boerhaaves, pe, dissection, pna, bronchitis, costochondritis   Krystal Patel is a 52 y.o. who presents to the ED with typical chest pain as described above.  Patient afebrile mildly hypertensive but in no acute distress.  Not clinically consistent with PE.  No evidence of pneumonia or congestive heart failure.  No evidence of bronchitis.  Doubt PE.  Symptoms are atypical.  Patient with a low risk heart score of 3 based on subjectivity will order serial enzymes to further risk stratify.  Will provide  pain medication.  Patient does endorse a history of anxiety and stress.  Clinical Course as of Aug 25 1732  Wed Aug 25, 2018  1658 Repeat troponin is negative.  At this point do believe she stable and appropriate for outpatient follow-up.  Have discussed with the patient and available family all diagnostics and treatments performed thus far and all questions were answered to the best of my ability. The patient demonstrates understanding and agreement with plan.    [PR]  1729 Patient with significant improvement after low-dose Ativan.  Requesting a small prescription for this.  Given her improvement in symptoms I do believe that is reasonable.  Patient will still need follow-up with cardiology.   [PR]    Clinical Course User Index [PR] Willy Eddy, MD     As part of my medical decision making, I reviewed the following data within the electronic MEDICAL RECORD NUMBER Nursing notes reviewed and incorporated, Labs reviewed, notes from prior ED visits and [place] Controlled Substance Database  ____________________________________________   FINAL CLINICAL IMPRESSION(S) / ED DIAGNOSES  Final diagnoses:  Chest pain, unspecified type      NEW MEDICATIONS STARTED DURING THIS VISIT:  New Prescriptions   LORAZEPAM (ATIVAN) 0.5 MG TABLET    Take 1 tablet (0.5 mg total) by mouth every 6 (six) hours as needed for anxiety.     Note:  This document was prepared using Dragon voice recognition software and may include unintentional dictation errors.    Willy Eddy, MD 08/25/18 [phone]
